# Patient Record
Sex: Male | Born: 1950 | Race: White | Hispanic: No | Marital: Single | Smoking: Never smoker
Health system: Southern US, Community
[De-identification: ages and names within clinical notes are randomized; demographics above are authoritative.]

## PROBLEM LIST (undated history)

## (undated) DIAGNOSIS — M1990 Unspecified osteoarthritis, unspecified site: Secondary | ICD-10-CM

## (undated) DIAGNOSIS — Z974 Presence of external hearing-aid: Secondary | ICD-10-CM

## (undated) DIAGNOSIS — J309 Allergic rhinitis, unspecified: Secondary | ICD-10-CM

## (undated) DIAGNOSIS — I872 Venous insufficiency (chronic) (peripheral): Secondary | ICD-10-CM

## (undated) DIAGNOSIS — I10 Essential (primary) hypertension: Secondary | ICD-10-CM

## (undated) DIAGNOSIS — G609 Hereditary and idiopathic neuropathy, unspecified: Secondary | ICD-10-CM

## (undated) DIAGNOSIS — C61 Malignant neoplasm of prostate: Secondary | ICD-10-CM

## (undated) DIAGNOSIS — M109 Gout, unspecified: Secondary | ICD-10-CM

## (undated) DIAGNOSIS — E538 Deficiency of other specified B group vitamins: Secondary | ICD-10-CM

## (undated) DIAGNOSIS — E785 Hyperlipidemia, unspecified: Secondary | ICD-10-CM

## (undated) DIAGNOSIS — Z9109 Other allergy status, other than to drugs and biological substances: Secondary | ICD-10-CM

## (undated) DIAGNOSIS — K449 Diaphragmatic hernia without obstruction or gangrene: Secondary | ICD-10-CM

## (undated) DIAGNOSIS — E7800 Pure hypercholesterolemia, unspecified: Secondary | ICD-10-CM

## (undated) HISTORY — DX: Gout, unspecified: M10.9

## (undated) HISTORY — PX: PROSTATE BIOPSY: SHX241

## (undated) HISTORY — DX: Malignant neoplasm of prostate: C61

---

## 2001-11-06 ENCOUNTER — Ambulatory Visit (HOSPITAL_COMMUNITY): Admission: RE | Admit: 2001-11-06 | Discharge: 2001-11-06 | Payer: Self-pay | Admitting: Family Medicine

## 2001-11-06 ENCOUNTER — Encounter: Payer: Self-pay | Admitting: Family Medicine

## 2007-07-20 ENCOUNTER — Emergency Department (HOSPITAL_COMMUNITY): Admission: EM | Admit: 2007-07-20 | Discharge: 2007-07-20 | Payer: Self-pay | Admitting: Emergency Medicine

## 2010-08-13 ENCOUNTER — Inpatient Hospital Stay (INDEPENDENT_AMBULATORY_CARE_PROVIDER_SITE_OTHER)
Admission: RE | Admit: 2010-08-13 | Discharge: 2010-08-13 | Disposition: A | Discharge status: Home / Self Care | Payer: BC Managed Care – PPO | Source: Ambulatory Visit | Attending: Family Medicine | Admitting: Family Medicine

## 2010-08-13 ENCOUNTER — Emergency Department (HOSPITAL_COMMUNITY)
Admission: EM | Admit: 2010-08-13 | Discharge: 2010-08-13 | Disposition: A | Discharge status: Home / Self Care | Payer: BC Managed Care – PPO | Attending: Emergency Medicine | Admitting: Emergency Medicine

## 2010-08-13 ENCOUNTER — Ambulatory Visit (INDEPENDENT_AMBULATORY_CARE_PROVIDER_SITE_OTHER): Payer: BC Managed Care – PPO

## 2010-08-13 DIAGNOSIS — M79609 Pain in unspecified limb: Secondary | ICD-10-CM | POA: Insufficient documentation

## 2010-08-13 DIAGNOSIS — S61409A Unspecified open wound of unspecified hand, initial encounter: Secondary | ICD-10-CM | POA: Insufficient documentation

## 2010-08-13 DIAGNOSIS — L03119 Cellulitis of unspecified part of limb: Secondary | ICD-10-CM

## 2010-08-13 DIAGNOSIS — L02519 Cutaneous abscess of unspecified hand: Secondary | ICD-10-CM | POA: Insufficient documentation

## 2010-08-13 DIAGNOSIS — X58XXXA Exposure to other specified factors, initial encounter: Secondary | ICD-10-CM | POA: Insufficient documentation

## 2010-08-13 DIAGNOSIS — M7989 Other specified soft tissue disorders: Secondary | ICD-10-CM | POA: Insufficient documentation

## 2010-08-13 DIAGNOSIS — E789 Disorder of lipoprotein metabolism, unspecified: Secondary | ICD-10-CM | POA: Insufficient documentation

## 2010-08-13 DIAGNOSIS — Z87828 Personal history of other (healed) physical injury and trauma: Secondary | ICD-10-CM | POA: Insufficient documentation

## 2010-08-13 DIAGNOSIS — B999 Unspecified infectious disease: Secondary | ICD-10-CM

## 2010-08-13 DIAGNOSIS — I10 Essential (primary) hypertension: Secondary | ICD-10-CM | POA: Insufficient documentation

## 2010-08-16 LAB — WOUND CULTURE

## 2011-02-03 ENCOUNTER — Inpatient Hospital Stay (HOSPITAL_COMMUNITY)
Admission: EM | Admit: 2011-02-03 | Discharge: 2011-02-06 | DRG: 297 | Disposition: A | Discharge status: Home / Self Care | Payer: BC Managed Care – PPO | Attending: Internal Medicine | Admitting: Internal Medicine

## 2011-02-03 ENCOUNTER — Encounter: Payer: Self-pay | Admitting: Emergency Medicine

## 2011-02-03 DIAGNOSIS — R933 Abnormal findings on diagnostic imaging of other parts of digestive tract: Secondary | ICD-10-CM | POA: Diagnosis present

## 2011-02-03 DIAGNOSIS — E871 Hypo-osmolality and hyponatremia: Principal | ICD-10-CM | POA: Diagnosis present

## 2011-02-03 DIAGNOSIS — A059 Bacterial foodborne intoxication, unspecified: Secondary | ICD-10-CM | POA: Diagnosis present

## 2011-02-03 DIAGNOSIS — E7800 Pure hypercholesterolemia, unspecified: Secondary | ICD-10-CM

## 2011-02-03 DIAGNOSIS — R066 Hiccough: Secondary | ICD-10-CM | POA: Diagnosis present

## 2011-02-03 DIAGNOSIS — R410 Disorientation, unspecified: Secondary | ICD-10-CM

## 2011-02-03 DIAGNOSIS — R112 Nausea with vomiting, unspecified: Secondary | ICD-10-CM

## 2011-02-03 DIAGNOSIS — L299 Pruritus, unspecified: Secondary | ICD-10-CM | POA: Diagnosis present

## 2011-02-03 DIAGNOSIS — G9340 Encephalopathy, unspecified: Secondary | ICD-10-CM | POA: Diagnosis present

## 2011-02-03 DIAGNOSIS — I10 Essential (primary) hypertension: Secondary | ICD-10-CM

## 2011-02-03 HISTORY — DX: Pure hypercholesterolemia, unspecified: E78.00

## 2011-02-03 HISTORY — DX: Essential (primary) hypertension: I10

## 2011-02-03 NOTE — ED Notes (Signed)
Pain increasing.

## 2011-02-03 NOTE — ED Notes (Signed)
Pt states he ate some fish, [place],  on Sunday afternoon in the [place] Monday morning he went to the hospital in the [place] and received a shot that broke him out in hives  Pt stayed in the [place] til Wednesday  Pt was seen at a hospital in [facility] on Wed  Pt has had hiccups since Sunday and pt is c/o stomach pain  Pt is weak, has nausea, and family states he has been disoriented  Pt has been taking thioridazine 25mg  for his hiccups but it is not helping and has been taking tums for the abd pain

## 2011-02-04 ENCOUNTER — Emergency Department (HOSPITAL_COMMUNITY): Payer: BC Managed Care – PPO

## 2011-02-04 ENCOUNTER — Encounter (HOSPITAL_COMMUNITY): Payer: Self-pay | Admitting: Family Medicine

## 2011-02-04 ENCOUNTER — Inpatient Hospital Stay (HOSPITAL_COMMUNITY): Payer: BC Managed Care – PPO

## 2011-02-04 DIAGNOSIS — R066 Hiccough: Secondary | ICD-10-CM | POA: Diagnosis present

## 2011-02-04 DIAGNOSIS — I10 Essential (primary) hypertension: Secondary | ICD-10-CM

## 2011-02-04 DIAGNOSIS — G9340 Encephalopathy, unspecified: Secondary | ICD-10-CM | POA: Diagnosis present

## 2011-02-04 DIAGNOSIS — E7800 Pure hypercholesterolemia, unspecified: Secondary | ICD-10-CM

## 2011-02-04 DIAGNOSIS — E871 Hypo-osmolality and hyponatremia: Secondary | ICD-10-CM | POA: Diagnosis present

## 2011-02-04 DIAGNOSIS — R112 Nausea with vomiting, unspecified: Secondary | ICD-10-CM

## 2011-02-04 LAB — CBC
HCT: 37 % — ABNORMAL LOW (ref 39.0–52.0)
MCHC: 35.3 g/dL (ref 30.0–36.0)
MCV: 92.7 fL (ref 78.0–100.0)
Platelets: 226 10*3/uL (ref 150–400)
RBC: 3.99 MIL/uL — ABNORMAL LOW (ref 4.22–5.81)
RDW: 11.9 % (ref 11.5–15.5)
WBC: 11.8 10*3/uL — ABNORMAL HIGH (ref 4.0–10.5)
WBC: 8.9 10*3/uL (ref 4.0–10.5)

## 2011-02-04 LAB — URINALYSIS, ROUTINE W REFLEX MICROSCOPIC
Bilirubin Urine: NEGATIVE
Bilirubin Urine: NEGATIVE
Glucose, UA: NEGATIVE mg/dL
Hgb urine dipstick: NEGATIVE
Ketones, ur: NEGATIVE mg/dL
Ketones, ur: NEGATIVE mg/dL
Leukocytes, UA: NEGATIVE
Nitrite: NEGATIVE
Urobilinogen, UA: 0.2 mg/dL (ref 0.0–1.0)
pH: 6.5 (ref 5.0–8.0)

## 2011-02-04 LAB — COMPREHENSIVE METABOLIC PANEL
ALT: 16 U/L (ref 0–53)
AST: 13 U/L (ref 0–37)
Albumin: 3.9 g/dL (ref 3.5–5.2)
Albumin: 3.7 g/dL (ref 3.5–5.2)
Alkaline Phosphatase: 50 U/L (ref 39–117)
BUN: 9 mg/dL (ref 6–23)
CO2: 25 mEq/L (ref 19–32)
Calcium: 9.4 mg/dL (ref 8.4–10.5)
Chloride: 89 mEq/L — ABNORMAL LOW (ref 96–112)
Creatinine, Ser: 0.9 mg/dL (ref 0.50–1.35)
Potassium: 3.4 mEq/L — ABNORMAL LOW (ref 3.5–5.1)
Sodium: 123 mEq/L — ABNORMAL LOW (ref 135–145)
Total Bilirubin: 0.6 mg/dL (ref 0.3–1.2)
Total Protein: 7.4 g/dL (ref 6.0–8.3)

## 2011-02-04 LAB — DIFFERENTIAL
Basophils Absolute: 0 10*3/uL (ref 0.0–0.1)
Basophils Relative: 0 % (ref 0–1)
Lymphocytes Relative: 13 % (ref 12–46)
Neutro Abs: 9.3 10*3/uL — ABNORMAL HIGH (ref 1.7–7.7)
Neutrophils Relative %: 79 % — ABNORMAL HIGH (ref 43–77)

## 2011-02-04 LAB — BASIC METABOLIC PANEL
CO2: 25 mEq/L (ref 19–32)
Calcium: 8.9 mg/dL (ref 8.4–10.5)
GFR calc Af Amer: >90 mL/min (ref >90)
Sodium: 135 mEq/L (ref 135–145)

## 2011-02-04 LAB — OSMOLALITY: Osmolality: 275 mOsm/kg (ref 275–300)

## 2011-02-04 LAB — GLUCOSE, CAPILLARY: Glucose-Capillary: 122 mg/dL — ABNORMAL HIGH (ref 70–99)

## 2011-02-04 LAB — FOLATE: Folate: 18.4 ng/mL

## 2011-02-04 LAB — VITAMIN B12: Vitamin B-12: 333 pg/mL (ref 211–911)

## 2011-02-04 MED ORDER — METOPROLOL TARTRATE 25 MG PO TABS
25.0000 mg | ORAL_TABLET | Freq: Two times a day (BID) | ORAL | Status: DC
  Administered 2011-02-04 – 2011-02-06 (×4): 25 mg via ORAL
  Filled 2011-02-04 (×5): qty 1

## 2011-02-04 MED ORDER — PANTOPRAZOLE SODIUM 40 MG PO TBEC
40.0000 mg | DELAYED_RELEASE_TABLET | Freq: Every day | ORAL | Status: DC
  Administered 2011-02-04 – 2011-02-06 (×3): 40 mg via ORAL
  Filled 2011-02-04 (×2): qty 1

## 2011-02-04 MED ORDER — DIPHENHYDRAMINE HCL 50 MG/ML IJ SOLN
12.5000 mg | Freq: Once | INTRAMUSCULAR | Status: AC
  Administered 2011-02-04: 12.5 mg via INTRAVENOUS
  Filled 2011-02-04: qty 1

## 2011-02-04 MED ORDER — ENOXAPARIN SODIUM 40 MG/0.4ML ~~LOC~~ SOLN
40.0000 mg | SUBCUTANEOUS | Status: AC
  Administered 2011-02-04: 40 mg via SUBCUTANEOUS
  Filled 2011-02-04: qty 0.4

## 2011-02-04 MED ORDER — POTASSIUM CHLORIDE IN NACL 20-0.9 MEQ/L-% IV SOLN
INTRAVENOUS | Status: DC
  Filled 2011-02-04: qty 1000

## 2011-02-04 MED ORDER — DIPHENHYDRAMINE HCL 50 MG/ML IJ SOLN
12.5000 mg | Freq: Three times a day (TID) | INTRAMUSCULAR | Status: DC | PRN
  Administered 2011-02-04 – 2011-02-05 (×3): 12.5 mg via INTRAVENOUS
  Filled 2011-02-04 (×3): qty 1

## 2011-02-04 MED ORDER — SODIUM CHLORIDE 0.9 % IV BOLUS (SEPSIS)
1000.0000 mL | Freq: Once | INTRAVENOUS | Status: AC
  Administered 2011-02-04: 1000 mL via INTRAVENOUS

## 2011-02-04 MED ORDER — METOPROLOL TARTRATE 25 MG PO TABS
25.0000 mg | ORAL_TABLET | ORAL | Status: AC
  Administered 2011-02-04: 25 mg via ORAL
  Filled 2011-02-04: qty 1

## 2011-02-04 MED ORDER — HYDROCODONE-ACETAMINOPHEN 5-325 MG PO TABS
1.0000 | ORAL_TABLET | ORAL | Status: DC | PRN
  Administered 2011-02-04 – 2011-02-05 (×4): 2 via ORAL
  Filled 2011-02-04 (×4): qty 2

## 2011-02-04 MED ORDER — ONDANSETRON HCL 4 MG/2ML IJ SOLN: 4.0000 mg | Freq: Four times a day (QID) | INTRAMUSCULAR | Status: DC | PRN

## 2011-02-04 MED ORDER — ACETAMINOPHEN 325 MG PO TABS: 650.0000 mg | ORAL_TABLET | Freq: Four times a day (QID) | ORAL | Status: DC | PRN

## 2011-02-04 MED ORDER — SODIUM CHLORIDE 0.9 % IV SOLN
INTRAVENOUS | Status: DC
  Administered 2011-02-04 (×2): via INTRAVENOUS

## 2011-02-04 MED ORDER — METOCLOPRAMIDE HCL 5 MG/ML IJ SOLN
5.0000 mg | Freq: Four times a day (QID) | INTRAMUSCULAR | Status: DC | PRN
  Administered 2011-02-04: 5 mg via INTRAVENOUS
  Filled 2011-02-04: qty 2

## 2011-02-04 MED ORDER — MORPHINE SULFATE 4 MG/ML IJ SOLN
INTRAMUSCULAR | Status: AC
  Administered 2011-02-04: 1 mg via INTRAVENOUS
  Filled 2011-02-04: qty 1

## 2011-02-04 MED ORDER — METOCLOPRAMIDE HCL 5 MG/ML IJ SOLN: 5.0000 mg | Freq: Four times a day (QID) | INTRAMUSCULAR | Status: DC | PRN

## 2011-02-04 MED ORDER — IOHEXOL 300 MG/ML  SOLN
100.0000 mL | Freq: Once | INTRAMUSCULAR | Status: AC | PRN
  Administered 2011-02-04: 100 mL via INTRAVENOUS

## 2011-02-04 MED ORDER — ENOXAPARIN SODIUM 40 MG/0.4ML ~~LOC~~ SOLN
40.0000 mg | SUBCUTANEOUS | Status: DC
  Administered 2011-02-05 – 2011-02-06 (×2): 40 mg via SUBCUTANEOUS
  Filled 2011-02-04 (×3): qty 0.4

## 2011-02-04 MED ORDER — CHLORPROMAZINE HCL 25 MG PO TABS
25.0000 mg | ORAL_TABLET | Freq: Three times a day (TID) | ORAL | Status: DC | PRN
  Administered 2011-02-06: 25 mg via ORAL
  Filled 2011-02-04: qty 1

## 2011-02-04 MED ORDER — ONDANSETRON HCL 4 MG/2ML IJ SOLN
4.0000 mg | Freq: Once | INTRAMUSCULAR | Status: AC
  Administered 2011-02-04: 4 mg via INTRAVENOUS
  Filled 2011-02-04: qty 2

## 2011-02-04 MED ORDER — ACETAMINOPHEN 650 MG RE SUPP: 650.0000 mg | Freq: Four times a day (QID) | RECTAL | Status: DC | PRN

## 2011-02-04 MED ORDER — MORPHINE SULFATE 2 MG/ML IJ SOLN
1.0000 mg | INTRAMUSCULAR | Status: DC | PRN
  Administered 2011-02-04: 1 mg via INTRAVENOUS

## 2011-02-04 MED ORDER — ONDANSETRON HCL 4 MG PO TABS: 4.0000 mg | ORAL_TABLET | Freq: Four times a day (QID) | ORAL | Status: DC | PRN

## 2011-02-04 MED ORDER — LORAZEPAM 2 MG/ML IJ SOLN
0.5000 mg | Freq: Four times a day (QID) | INTRAMUSCULAR | Status: DC | PRN
  Administered 2011-02-04 – 2011-02-05 (×4): 0.5 mg via INTRAVENOUS
  Filled 2011-02-04 (×4): qty 1

## 2011-02-04 MED ORDER — HYDROMORPHONE HCL PF 1 MG/ML IJ SOLN: 0.5000 mg | INTRAMUSCULAR | Status: DC | PRN

## 2011-02-04 MED ORDER — POTASSIUM CHLORIDE IN NACL 20-0.9 MEQ/L-% IV SOLN
INTRAVENOUS | Status: AC
  Filled 2011-02-04: qty 1000

## 2011-02-04 MED ORDER — ZOLPIDEM TARTRATE 5 MG PO TABS: 5.0000 mg | ORAL_TABLET | Freq: Every evening | ORAL | Status: DC | PRN

## 2011-02-04 NOTE — ED Notes (Signed)
Pt transported to the radiology at this time

## 2011-02-04 NOTE — H&P (Addendum)
PCP:  Dr Lawrence Scott  Chief Complaint:  Nausea, vomiting, confused  HPI: This is a unfortunate 60 y/o gentleman who was on vacation in the [place], he states he ate grouper fish at 5PM and by 8PM he had severe nausea, vomiting and diarrhea that led him to be hospitalized for 2 days in the [place]. He was finally airlifted out to a hospital in [facility]. By the time of his transfer his vomiting and diarrhea had resolved, he still has some nausea. He also has severe hiccups. This was treated with Mellaril without improvement. He had no hematemesis, today he had a bowel movement and on wiping noted a bloody smear on the tissue. He reports some abdominal discomfort but mostly with the hiccups. He has no fever but his is constantly cold. He was discharged from the hospital in [place] after one day and flew home. He has continued not to feel well, not eating. His girlfriend who brought him to the ER today states he has also been confused and forgetful. History obtained from the patient who now appears alert and oriented but ill, as well as his girlfriend who is at the bedside.  Review of Systems: (positives bolded) The patient denies anorexia, fever, weight loss,, vision loss, decreased hearing, hoarseness, chest pain, syncope, dyspnea on exertion, peripheral edema, balance deficits, hemoptysis, abdominal pain, melena, hematochezia, severe indigestion/heartburn, hematuria, incontinence, genital sores, muscle weakness, suspicious skin lesions, transient blindness, difficulty walking, depression, unusual weight change, abnormal bleeding, enlarged lymph nodes, angioedema, and breast masses.  Past Medical History: Past Medical History  Diagnosis Date  . High cholesterol   . Hypertension    History reviewed. No pertinent past surgical history.  Medications: Prior to Admission medications   Medication Sig Start Date End Date Taking? Authorizing Provider  calcium carbonate (TUMS - DOSED IN MG  ELEMENTAL CALCIUM) 500 MG chewable tablet Chew 1 tablet by mouth as needed. HEARTBURN...   Yes Historical Provider, MD  thioridazine (MELLARIL) 25 MG tablet Take 25 mg by mouth 3 (three) times daily. STARTED 02/02/11...Marland KitchenCOURSE IS FOR 7 DAYS FOR HICCUPS.   Yes Historical Provider, MD    Allergies:  No Known Allergies  Social History:  reports that he has never smoked. He does not have any smokeless tobacco history on file. He reports that he drinks about .5 ounces of alcohol per week. He reports that he does not use illicit drugs.  Family History: Family History  Problem Relation Age of Onset  . Lung cancer      Physical Exam: Filed Vitals:   02/04/11 0041 02/04/11 0200 02/04/11 0209 02/04/11 0308  BP: 159/92 179/85  168/80  Pulse: 83 101 80 89  Temp: 98.8 F (37.1 C)   96.4 F (35.8 C)  TempSrc: Oral   Oral  Resp: 20     SpO2: 99% 97% 100% 100%    General:  Alert and oriented times three, well developed and nourished, ill appearing gentleman, hiccups Eyes: PERRLA, pink conjunctiva, no scleral icterus ENT: Dry oral mucosa, neck supple, no thyromegaly Lungs: clear to ascultation, no wheeze, no crackles, no use of accessory muscles Cardiovascular: regular rate and rhythm, no regurgitation, no gallops, no murmurs. No carotid bruits, no JVD Abdomen: soft, positive BS, non-tender, non-distended, no organomegaly, not an acute abdomen GU: not examined Neuro: CN II - XII grossly intact, sensation intact Musculoskeletal: strength 5/5 all extremities, no clubbing, cyanosis or edema, reproducible TTP abdomen Skin: no rash, no subcutaneous crepitation, no decubitus Psych: appropriate patient  Labs on Admission:   [facility] 02/04/11 0045  NA 123*  K 3.4*  CL 89*  CO2 25  GLUCOSE 110*  BUN 10  CREATININE 0.87  CALCIUM 9.5  MG --  PHOS --    Basename 02/04/11 0045  AST 13  ALT 16  ALKPHOS 50  BILITOT 0.6  PROT 7.6  ALBUMIN 3.9    Basename 02/04/11 0045  LIPASE 29    AMYLASE --    Basename 02/04/11 0045  WBC 11.8*  NEUTROABS 9.3*  HGB 13.0  HCT 36.8*  MCV 92.5  PLT 224   No results found for this basename: CKTOTAL:3,CKMB:3,CKMBINDEX:3,TROPONINI:3 in the last 72 hours No results found for this basename: TSH,T4TOTAL,FREET3,T3FREE,THYROIDAB in the last 72 hours No results found for this basename: VITAMINB12:2,FOLATE:2,FERRITIN:2,TIBC:2,IRON:2,RETICCTPCT:2 in the last 72 hours  Radiological Exams on Admission: No results found.  Assessment/Plan Present on Admission:  Hyponatremia - ? d/t N/V food poisoning Encephalopathy - likely d/t hyponatremia -admit -start w/u for hyponatremia, CXR, TSH, urine sodium, urine and plasma osmolarity -IVF started, serial BMP -PPI -CLD Hiccups -on thorazine without relief, add PRN reglan Nausea -diarrhea resolved -PPI and zofran PRN HTN -metoprolol  Tingling finger tips -likely related to electrolyte imbalance, also check Bit B12 level.   Code status: full code DVT/GI prophylaxis Dr Lawrence Scott assumes care in AM   Lawrence Scott, Lawrence Scott 02/04/2011, 3:40 AM    Chart reviewed patient admitted this morning with severe Hiccups.  Continue current treatment.  Sodium has improved with IVF.  Continue to monitor

## 2011-02-04 NOTE — ED Notes (Signed)
Pt aware of need for urine specimen; warm blankets given; family at bedside

## 2011-02-04 NOTE — ED Provider Notes (Signed)
History     CSN: [id number] Arrival date & time: 02/03/2011  7:15 PM   First MD Initiated Contact with Patient 02/04/11 0049      Chief Complaint  Patient presents with  . Food Poisioning/Fish     (Consider location/radiation/quality/duration/timing/severity/associated sxs/prior treatment) Patient is a 60 y.o. male presenting with vomiting. The history is provided by the patient.  Emesis  This is a new problem. The current episode started 2 days ago. The problem occurs more than 10 times per day. The problem has been gradually worsening. The emesis has an appearance of stomach contents (No blood or bilious emesis). Maximum temperature: Subjective fevers on and off. Associated symptoms include abdominal pain, diarrhea, myalgias and sweats. Pertinent negatives include no chills, no fever and no headaches. Risk factors include travel to endemic areas (He believes symptoms started after eating fish in the vomitus).   Symptoms are moderate to severe in nature abdominal pain described as sharp epigastric and intermittent not present at this time. He was hospitalized for this 2 days ago in the [place] and was transported to [place] where he was admitted and now presents here for further evaluation. Symptoms are persistent. His significant other bedside states he is confused. He complains of some tingling in his fingers but no perioral paresthesias, no painful paresthesias in his extremities, no complaints of temperature reversal. No rash or flushing. Past Medical History  Diagnosis Date  . High cholesterol     History reviewed. No pertinent past surgical history.  History reviewed. No pertinent family history.  History  Substance Use Topics  . Smoking status: Never Smoker   . Smokeless tobacco: Not on file  . Alcohol Use: 0.5 oz/week    1 drink(s) per week     one mixed drink before bed       Review of Systems  Constitutional: Negative for fever and chills.  HENT: Negative for neck  pain and neck stiffness.   Eyes: Negative for pain.  Respiratory: Negative for shortness of breath.   Cardiovascular: Negative for chest pain and palpitations.  Gastrointestinal: Positive for vomiting, abdominal pain and diarrhea. Negative for constipation and blood in stool.  Genitourinary: Negative for dysuria.  Musculoskeletal: Positive for myalgias. Negative for back pain.  Skin: Negative for rash.  Neurological: Negative for headaches.  Psychiatric/Behavioral: Positive for confusion.  All other systems reviewed and are negative.    Allergies  Review of patient's allergies indicates no known allergies.  Home Medications   Current Outpatient Rx  Name Route Sig Dispense Refill  . CALCIUM CARBONATE ANTACID 500 MG PO CHEW Oral Chew 1 tablet by mouth as needed. HEARTBURN...    . THIORIDAZINE HCL 25 MG PO TABS Oral Take 25 mg by mouth 3 (three) times daily. STARTED 02/02/11...Marland KitchenCOURSE IS FOR 7 DAYS FOR HICCUPS.      BP 179/85  Pulse 80  Temp(Src) 98.8 F (37.1 C) (Oral)  Resp 20  SpO2 100%  Physical Exam  Constitutional: He is oriented to person, place, and time. He appears well-developed and well-nourished.  HENT:  Head: Normocephalic and atraumatic.  Eyes: Conjunctivae and EOM are normal. Pupils are equal, round, and reactive to light.  Neck: Trachea normal. Neck supple. No thyromegaly present.  Cardiovascular: Normal rate, regular rhythm, S1 normal, S2 normal and normal pulses.     No systolic murmur is present   No diastolic murmur is present  Pulses:      Radial pulses are 2+ on the right side, and 2+ on  the left side.  Pulmonary/Chest: Effort normal and breath sounds normal. He has no wheezes. He has no rhonchi. He has no rales. He exhibits no tenderness.  Abdominal: Soft. Normal appearance and bowel sounds are normal. There is no tenderness. There is no CVA tenderness and negative Murphy's sign.       No peritonitis. No localized area of discomfort.  Musculoskeletal:        BLE:s Calves nontender, no cords or erythema, negative Homans sign  Neurological: He is alert and oriented to person, place, and time. He has normal strength. No cranial nerve deficit or sensory deficit. GCS eye subscore is 4. GCS verbal subscore is 5. GCS motor subscore is 6.       Speech is slow otherwise answers all questions appropriately  Skin: Skin is warm and dry. No rash noted. He is not diaphoretic.  Psychiatric: His speech is normal.       Cooperative and appropriate    ED Course  Procedures (including critical care time)  Labs Reviewed  GLUCOSE, CAPILLARY - Abnormal; Notable for the following:    Glucose-Capillary 122 (*)    All other components within normal limits  CBC - Abnormal; Notable for the following:    WBC 11.8 (*)    RBC 3.98 (*)    HCT 36.8 (*)    All other components within normal limits  DIFFERENTIAL - Abnormal; Notable for the following:    Neutrophils Relative 79 (*)    Neutro Abs 9.3 (*)    All other components within normal limits  COMPREHENSIVE METABOLIC PANEL - Abnormal; Notable for the following:    Sodium 123 (*)    Potassium 3.4 (*)    Chloride 89 (*)    Glucose, Bld 110 (*)    All other components within normal limits  URINALYSIS, ROUTINE W REFLEX MICROSCOPIC - Abnormal; Notable for the following:    Specific Gravity, Urine 1.003 (*)    All other components within normal limits  LIPASE, BLOOD  POCT CBG MONITORING   No results found.  2:30 AM MED c/s to the hospitalist on call who agrees to evaluation and admission. Serial abdominal exams, no peritonitis or indication for CT at this time  MDM  Nausea vomiting and diarrhea with confusion. Found to be hyponatremic. Given IV fluids and Zofran. Labs reviewed as above. Medicine consult for admission.        Sunnie Nielsen, MD 02/04/11 0230

## 2011-02-04 NOTE — ED Notes (Signed)
Pt sitting up; positioned pillow behind his back which he states has made him feel better; no hiccups at this time; denies any c/o chest pain; bolus completed; tel nsr; family at bedside; no nausea

## 2011-02-04 NOTE — ED Notes (Signed)
Admitting MD in the room with the pt, family at the bedside, no apparent distress noted, pt lying on the stretcher, breathing even and regular, at this time pt requesting urinal,  At the bediside

## 2011-02-04 NOTE — ED Notes (Signed)
Pt states that he become itchy in between his toes, potassium started prior, at this time iv infusion stopped, EDP notified, pt frequently checked, pt denies Shob, difficulty breathing, no itching reported to any other area.

## 2011-02-05 LAB — BASIC METABOLIC PANEL
CO2: 25 mEq/L (ref 19–32)
Chloride: 103 mEq/L (ref 96–112)
Creatinine, Ser: 1.09 mg/dL (ref 0.50–1.35)
Potassium: 3.7 mEq/L (ref 3.5–5.1)
Sodium: 135 mEq/L (ref 135–145)

## 2011-02-05 LAB — DRUGS OF ABUSE SCREEN W/O ALC, ROUTINE URINE
Barbiturate Quant, Ur: NEGATIVE
Cocaine Metabolites: NEGATIVE
Methadone: NEGATIVE
Phencyclidine (PCP): NEGATIVE

## 2011-02-05 LAB — CBC
MCV: 95.1 fL (ref 78.0–100.0)
Platelets: 251 10*3/uL (ref 150–400)
RBC: 3.86 MIL/uL — ABNORMAL LOW (ref 4.22–5.81)
WBC: 7.4 10*3/uL (ref 4.0–10.5)

## 2011-02-05 LAB — OSMOLALITY, URINE: Osmolality, Ur: 380 mOsm/kg — ABNORMAL LOW (ref 390–1090)

## 2011-02-05 MED ORDER — DIPHENHYDRAMINE HCL 25 MG PO CAPS
25.0000 mg | ORAL_CAPSULE | Freq: Four times a day (QID) | ORAL | Status: DC | PRN
  Administered 2011-02-05: 25 mg via ORAL
  Filled 2011-02-05: qty 1

## 2011-02-05 MED ORDER — METHYLPREDNISOLONE SODIUM SUCC 40 MG IJ SOLR
40.0000 mg | Freq: Every day | INTRAMUSCULAR | Status: AC
  Administered 2011-02-05: 40 mg via INTRAVENOUS
  Filled 2011-02-05: qty 1

## 2011-02-05 NOTE — Progress Notes (Signed)
Subjective: Complains of itching all over  Objective: Vital signs in last 24 hours: Temp:  [98.2 F (36.8 C)-99.6 F (37.6 C)] 99.3 F (37.4 C) (11/11 1407) Pulse Rate:  [74-83] 81  (11/11 1407) Resp:  [16-18] 18  (11/11 1407) BP: (126-161)/(77-89) 159/83 mmHg (11/11 1407) SpO2:  [95 %-99 %] 99 % (11/11 1407) Weight change:   -Intake/Output from previous day: 11/10 0701 - 11/11 0700 In: 1980 [P.O.:480; I.V.:1500] Out: 1500 [Urine:1500]  Blood pressure 159/83, pulse 81, temperature 99.3 F (37.4 C), temperature source Oral, resp. rate 18, height 5\' 11"  (1.803 m), weight 96.3 kg (212 lb 4.9 oz), SpO2 99.00%. HEENT: normal Cardio: RRR Resp: CTA B/L GI: BS positive Extremity:  No Edema  Lab Results: Lab Results  Component Value Date   WBC 7.4 02/05/2011   HGB 12.8* 02/05/2011   HCT 36.7* 02/05/2011   MCV 95.1 02/05/2011   PLT 251 02/05/2011    BMET  Lab 02/05/11 0530  NA 135  K 3.7  CL 103  CO2 25  BUN 10  CREATININE 1.09  LABGLOM --  GLUCOSE 99  CALCIUM 8.9    Studies/Results: Dg Chest 2 View  02/04/2011  *RADIOLOGY REPORT*  Clinical Data: Shortness of breath and transient chest pain; hyponatremia.  CHEST - 2 VIEW  Comparison: None.  Findings: The lungs are well-aerated.  Minimal bibasilar opacities likely reflect atelectasis.  There is no evidence of pleural effusion or pneumothorax.  The heart is borderline normal in size; the mediastinal contour is within normal limits.  No acute osseous abnormalities are seen.  IMPRESSION: Minimal bibasilar opacities likely reflect atelectasis; lungs otherwise clear.  Original Report Authenticated By: Tonia Ghent, M.D.   Ct Angio Chest W/cm &/or Wo Cm  02/04/2011  *RADIOLOGY REPORT*  Clinical Data:  Hiccups.  Shortness of breath.  Chest pain.  CT ANGIOGRAPHY CHEST WITH CONTRAST  Technique:  Multidetector CT imaging of the chest was performed using the standard protocol during bolus administration of intravenous contrast.   Multiplanar CT image reconstructions including MIPs were obtained to evaluate the vascular anatomy.  Contrast: OMNIPAQUE IOHEXOL 300 MG/ML IV SOLN  Comparison:  Two-view chest x-ray 02/04/2011.  Findings:  There is satisfactory opacification of the pulmonary arterial system.  The study is mildly degraded by patient motion. No focal filling defects are present to suggest emboli.  Atherosclerotic calcifications are present at the aortic arch without aneurysm or focal stenosis.  No significant mediastinal or axillary adenopathy is present. Atherosclerotic calcifications are noted within the LAD.  There is moderate wall thickening in the distal esophagus.  More proximal esophagus is dilated.  A small hiatal hernia is suspected.  Limited imaging of the upper abdomen is unremarkable.  The lung windows are mildly degraded by patient breathing motion. There is mild dependent atelectasis bilaterally.  No focal nodule, mass, or airspace disease is present.  The bone windows demonstrate prominent anterior osteophytes in the lower thoracic spine.  No focal lytic or blastic lesions are present.  Review of the MIP images confirms the above findings.  IMPRESSION:  1.  No evidence for pulmonary embolus. 2.  Thickening of the distal esophagus and proximal dilation.  This suggests esophageal dysfunction.  No focal mass lesion is identified, but neoplasm is not excluded. Endoscopy may be warranted. 3.  Small hiatal hernia. 4.  Mild dependent atelectasis. 5.  The study is mildly degraded by patient breathing motion, limiting sensitivity for small emboli. 6.  Atherosclerotic changes including coronary artery disease.  Original Report Authenticated By: Jamesetta Orleans. MATTERN, M.D.    Medications:  Current Facility-Administered Medications  Medication Dose Route Frequency Provider Last Rate Last Dose  . 0.9 % NaCl with KCl 20 mEq/ L  infusion   Intravenous To ER Debby Crosley, MD      . acetaminophen (TYLENOL) tablet 650 mg   650 mg Oral Q6H PRN Debby Crosley, MD       Or  . acetaminophen (TYLENOL) suppository 650 mg  650 mg Rectal Q6H PRN Debby Crosley, MD      . chlorproMAZINE (THORAZINE) tablet 25 mg  25 mg Oral TID PRN Debby Crosley, MD      . diphenhydrAMINE (BENADRYL) capsule 25 mg  25 mg Oral Q6H PRN Tiphany Fayson Jarrett-Davis      . diphenhydrAMINE (BENADRYL) injection 12.5 mg  12.5 mg Intravenous Once Chaelyn Bunyan Jarrett-Davis   12.5 mg at 02/04/11 1857  . enoxaparin (LOVENOX) injection 40 mg  40 mg Subcutaneous Q24H Debby Crosley, MD   40 mg at 02/05/11 0550  . HYDROcodone-acetaminophen (NORCO) 5-325 MG per tablet 1-2 tablet  1-2 tablet Oral Q4H PRN Gery Pray, MD   2 tablet at 02/05/11 0916  . HYDROmorphone (DILAUDID) injection 0.5 mg  0.5 mg Intravenous Q4H PRN Kataleena Holsapple Jarrett-Davis      . iohexol (OMNIPAQUE) 300 MG/ML injection 100 mL  100 mL Intravenous Once PRN Medication Radiologist   100 mL at 02/04/11 1921  . LORazepam (ATIVAN) injection 0.5 mg  0.5 mg Intravenous Q6H PRN Jamar Casagrande Jarrett-Davis   0.5 mg at 02/05/11 1637  . methylPREDNISolone sodium succinate (SOLU-MEDROL) 40 MG injection 40 mg  40 mg Intravenous Daily Tilley Faeth Jarrett-Davis      . metoCLOPramide (REGLAN) injection 5 mg  5 mg Intravenous Q6H PRN Debby Crosley, MD   5 mg at 02/04/11 1102  . metoCLOPramide (REGLAN) injection 5 mg  5 mg Intravenous Q6H PRN Debby Crosley, MD      . metoprolol tartrate (LOPRESSOR) tablet 25 mg  25 mg Oral BID Debby Crosley, MD   25 mg at 02/05/11 0910  . ondansetron (ZOFRAN) tablet 4 mg  4 mg Oral Q6H PRN Debby Crosley, MD       Or  . ondansetron (ZOFRAN) injection 4 mg  4 mg Intravenous Q6H PRN Debby Crosley, MD      . pantoprazole (PROTONIX) EC tablet 40 mg  40 mg Oral QAC breakfast Debby Crosley, MD   40 mg at 02/05/11 0910  . zolpidem (AMBIEN) tablet 5 mg  5 mg Oral QHS PRN Gery Pray, MD      . DISCONTD: 0.9 %  sodium chloride infusion   Intravenous Continuous Benay Pomeroy Jarrett-Davis 75 mL/hr at 02/04/11 2148      . DISCONTD: diphenhydrAMINE (BENADRYL) injection 12.5 mg  12.5 mg Intravenous Q8H PRN Chenoah Mcnally Jarrett-Davis   12.5 mg at 02/05/11 1430    Assessment/Plan:  *Hiccups Improving  Hyponatremia Improved with hydration.  D/c IVF  Encephalopathy Resolved  Nausea & vomiting Resolved  Hypertension Metoprolol  Hypercholesteremia OP follow up Abnormal CT of Esophagus: GI consult in AM Itching Unsure of etiology, Benadryl PRN and IV solumedrol.    LOS: 2 days   Earlene Plater MD, [place] Pier 02/05/2011, 6:38 PM

## 2011-02-06 ENCOUNTER — Encounter (HOSPITAL_COMMUNITY)
Admission: EM | Disposition: A | Discharge status: Home / Self Care | Payer: Self-pay | Source: Home / Self Care | Attending: Internal Medicine

## 2011-02-06 LAB — CBC
MCHC: 34.6 g/dL (ref 30.0–36.0)
MCV: 94 fL (ref 78.0–100.0)
Platelets: 296 10*3/uL (ref 150–400)
RDW: 11.8 % (ref 11.5–15.5)
WBC: 6.8 10*3/uL (ref 4.0–10.5)

## 2011-02-06 LAB — BASIC METABOLIC PANEL
Chloride: 101 mEq/L (ref 96–112)
Creatinine, Ser: 1.09 mg/dL (ref 0.50–1.35)
GFR calc Af Amer: 83 mL/min — ABNORMAL LOW (ref >90)
GFR calc non Af Amer: 71 mL/min — ABNORMAL LOW (ref >90)

## 2011-02-06 SURGERY — EGD (ESOPHAGOGASTRODUODENOSCOPY)
Anesthesia: Moderate Sedation

## 2011-02-06 MED ORDER — METOPROLOL SUCCINATE ER 50 MG PO TB24: 50.0000 mg | ORAL_TABLET | Freq: Every day | ORAL | Status: DC

## 2011-02-06 MED ORDER — ZOLPIDEM TARTRATE 5 MG PO TABS: 5.0000 mg | ORAL_TABLET | Freq: Every evening | ORAL | Status: DC | PRN

## 2011-02-06 MED ORDER — PROMETHAZINE HCL 12.5 MG PO TABS: 12.5000 mg | ORAL_TABLET | Freq: Four times a day (QID) | ORAL | Status: AC | PRN

## 2011-02-06 MED ORDER — HYDROCODONE-ACETAMINOPHEN 5-325 MG PO TABS: 1.0000 | ORAL_TABLET | ORAL | Status: AC | PRN

## 2011-02-06 MED ORDER — PROMETHAZINE HCL 12.5 MG PO TABS: 12.5000 mg | ORAL_TABLET | Freq: Four times a day (QID) | ORAL | Status: DC | PRN

## 2011-02-06 NOTE — Discharge Summary (Signed)
DISCHARGE SUMMARY  Lawrence Scott  MR#: [id number]  DOB:04/15/1950  Date of Admission: 02/03/2011 Date of Discharge: 02/06/2011  Attending Physician:Davis MD, Ladell Pier  Patient's PCP: Dr Nilda Simmer  Consults:Treatment Team:  Carollee Massed  Discharge Diagnoses: .Hyponatremia .Encephalopathy .Hiccups Hypertension Generalized itching Abnormal esophagus seen on CT of the chest   Discharge Medication List as of 02/06/2011  1:28 PM    START taking these medications   Details  metoprolol (TOPROL XL) 50 MG 24 hr tablet Take 1 tablet (50 mg total) by mouth daily., Starting 02/06/2011, Until Tue 02/06/12, Normal    zolpidem (AMBIEN) 5 MG tablet Take 1 tablet (5 mg total) by mouth at bedtime as needed for sleep (insomnia)., Starting 02/06/2011, Until Tue 02/06/12, Print      CONTINUE these medications which have CHANGED   Details  promethazine (PHENERGAN) 12.5 MG tablet Take 1 tablet (12.5 mg total) by mouth every 6 (six) hours as needed for nausea., Starting 02/06/2011, Until Mon 02/13/11, Print      CONTINUE these medications which have NOT CHANGED   Details  calcium carbonate (TUMS - DOSED IN MG ELEMENTAL CALCIUM) 500 MG chewable tablet Chew 1 tablet by mouth as needed. HEARTBURN..., Until Discontinued, Historical Med      STOP taking these medications     thioridazine (MELLARIL) 25 MG tablet        HPI:  This is a unfortunate 60 y/o gentleman who was on vacation in the [place], he states he ate grouper fish at 5PM and by 8PM he had severe nausea, vomiting and diarrhea that led him to be hospitalized for 2 days in the [place]. He was finally airlifted out to a hospital in [facility]. By the time of his transfer his vomiting and diarrhea had resolved, he still has some nausea. He also has severe hiccups. This was treated with Mellaril without improvement. He had no hematemesis, today he had a bowel movement and on wiping noted a bloody smear on  the tissue. He reports some abdominal discomfort but mostly with the hiccups. He has no fever but his is constantly cold. He was discharged from the hospital in [place] after one day and flew home. He has continued not to feel well, not eating. His girlfriend who brought him to the ER today states he has also been confused and forgetful. History obtained from the patient who now appears alert and oriented but ill, as well as his girlfriend who is at the bedside.  Hospital Course: Marland KitchenHyponatremia Patient was admitted with hyponatremia most likely secondary to dehydration. He received IV fluids and hyponatremia has now resolved. Patient had nausea vomiting diarrhea secondary to food poisoning body within the bile, is. He now feels like he is progressively getting better. He will be discharged home he will follow up with his primary care physician.   .Encephalopathy Patient was noted to have some encephalopathy this was thought to be secondary to dehydration and electrolyte abnormality. That has since resolved.   Marland KitchenHiccups: Patient complains of intractable hiccups has been going on since he had to food poisoning. That has calmed down quite a bit with treatment with Thorazine and Reglan. In the workup for his hiccups he was noted to have an abnormal esophagus on the CT scan of the chest. GI was consulted Dr. Loreta Ave and was about to do a endoscopy. Patient stated that he does have a family friend outpatient  that is a gastroenterologist and he will follow up with him for the  upper endoscopy.  Hypertension: Patient was discharged on Toprol-XL 50 mg daily. He will follow up with his regular doctor to recheck his blood pressure. He had no chest pain or shortness of breath prior to discharge.   Day of Discharge BP 152/86  Pulse 86  Temp(Src) 99.3 F (37.4 C) (Axillary)  Resp 18  Ht 5\' 11"  (1.803 m)  Wt 96.3 kg (212 lb 4.9 oz)  BMI 29.61 kg/m2  SpO2 99%  Physical Exam: General: Patient appears his  stated age. HEENT: Head normocephalic atraumatic pupils reactive to light. Cardiovascular: Regular rate rhythm. Lungs: clear to auscultation bilaterally. Abdomen:Soft nontender nondistended positive bowel sounds. Extremities: no edema.  Results for orders placed during the hospital encounter of 02/03/11 (from the past 24 hour(s))  CBC     Status: Abnormal   Collection Time   02/06/11  5:20 AM      Component Value Range   WBC 6.8  4.0 - 10.5 (K/uL)   RBC 4.00 (*) 4.22 - 5.81 (MIL/uL)   Hemoglobin 13.0  13.0 - 17.0 (g/dL)   HCT 95.2 (*) 84.1 - 52.0 (%)   MCV 94.0  78.0 - 100.0 (fL)   MCH 32.5  26.0 - 34.0 (pg)   MCHC 34.6  30.0 - 36.0 (g/dL)   RDW 32.4  40.1 - 02.7 (%)   Platelets 296  150 - 400 (K/uL)  BASIC METABOLIC PANEL     Status: Abnormal   Collection Time   02/06/11  5:20 AM      Component Value Range   Sodium 136  135 - 145 (mEq/L)   Potassium 4.0  3.5 - 5.1 (mEq/L)   Chloride 101  96 - 112 (mEq/L)   CO2 26  19 - 32 (mEq/L)   Glucose, Bld 139 (*) 70 - 99 (mg/dL)   BUN 10  6 - 23 (mg/dL)   Creatinine, Ser 2.53  0.50 - 1.35 (mg/dL)   Calcium 9.2  8.4 - 66.4 (mg/dL)   GFR calc non Af Amer 71 (*) >90 (mL/min)   GFR calc Af Amer 83 (*) >90 (mL/min)    Disposition: Patient to followup with his primary care physician Dr. Thornton Dales within one week.  Follow-up Appts: Discharge Orders    Future Orders Please Complete By Expires   Diet - low sodium heart healthy      Increase activity slowly            Tests Needing Follow-up: EGD  Signed: Earlene Plater MD, Ladell Pier 02/06/2011, 6:35 PM

## 2011-02-06 NOTE — Progress Notes (Signed)
UR completed 

## 2011-02-08 LAB — OPIATE, QUANTITATIVE, URINE
Hydrocodone: 2236 NG/ML — ABNORMAL HIGH
Morphine, Confirm: NEGATIVE NG/ML

## 2011-02-10 LAB — CULTURE, BLOOD (ROUTINE X 2)
Culture  Setup Time: 201211101115
Culture: NO GROWTH

## 2014-11-14 DIAGNOSIS — Z23 Encounter for immunization: Secondary | ICD-10-CM | POA: Diagnosis not present

## 2015-01-22 DIAGNOSIS — Z0000 Encounter for general adult medical examination without abnormal findings: Secondary | ICD-10-CM | POA: Diagnosis not present

## 2015-01-22 DIAGNOSIS — M109 Gout, unspecified: Secondary | ICD-10-CM | POA: Diagnosis not present

## 2015-01-22 DIAGNOSIS — Z0184 Encounter for antibody response examination: Secondary | ICD-10-CM | POA: Diagnosis not present

## 2015-01-22 DIAGNOSIS — Z125 Encounter for screening for malignant neoplasm of prostate: Secondary | ICD-10-CM | POA: Diagnosis not present

## 2015-01-22 DIAGNOSIS — I10 Essential (primary) hypertension: Secondary | ICD-10-CM | POA: Diagnosis not present

## 2015-01-22 DIAGNOSIS — E785 Hyperlipidemia, unspecified: Secondary | ICD-10-CM | POA: Diagnosis not present

## 2015-01-22 DIAGNOSIS — K219 Gastro-esophageal reflux disease without esophagitis: Secondary | ICD-10-CM | POA: Diagnosis not present

## 2015-01-22 DIAGNOSIS — Z23 Encounter for immunization: Secondary | ICD-10-CM | POA: Diagnosis not present

## 2015-03-05 DIAGNOSIS — R739 Hyperglycemia, unspecified: Secondary | ICD-10-CM | POA: Diagnosis not present

## 2015-03-05 DIAGNOSIS — E559 Vitamin D deficiency, unspecified: Secondary | ICD-10-CM | POA: Diagnosis not present

## 2015-03-05 DIAGNOSIS — E871 Hypo-osmolality and hyponatremia: Secondary | ICD-10-CM | POA: Diagnosis not present

## 2015-06-11 DIAGNOSIS — H2513 Age-related nuclear cataract, bilateral: Secondary | ICD-10-CM | POA: Diagnosis not present

## 2015-06-11 DIAGNOSIS — H5203 Hypermetropia, bilateral: Secondary | ICD-10-CM | POA: Diagnosis not present

## 2015-06-11 DIAGNOSIS — H43813 Vitreous degeneration, bilateral: Secondary | ICD-10-CM | POA: Diagnosis not present

## 2015-06-28 DIAGNOSIS — M722 Plantar fascial fibromatosis: Secondary | ICD-10-CM | POA: Diagnosis not present

## 2015-08-09 ENCOUNTER — Ambulatory Visit
Admission: RE | Admit: 2015-08-09 | Discharge: 2015-08-09 | Disposition: A | Discharge status: Home / Self Care | Payer: Medicare Other | Source: Ambulatory Visit | Attending: Family | Admitting: Family

## 2015-08-09 ENCOUNTER — Other Ambulatory Visit: Payer: Self-pay | Admitting: Family

## 2015-08-09 DIAGNOSIS — M79671 Pain in right foot: Secondary | ICD-10-CM

## 2015-08-09 DIAGNOSIS — Z79899 Other long term (current) drug therapy: Secondary | ICD-10-CM | POA: Diagnosis not present

## 2015-08-09 DIAGNOSIS — Z8739 Personal history of other diseases of the musculoskeletal system and connective tissue: Secondary | ICD-10-CM | POA: Diagnosis not present

## 2015-08-09 DIAGNOSIS — M19071 Primary osteoarthritis, right ankle and foot: Secondary | ICD-10-CM | POA: Diagnosis not present

## 2015-08-24 DIAGNOSIS — S99821A Other specified injuries of right foot, initial encounter: Secondary | ICD-10-CM | POA: Diagnosis not present

## 2015-08-24 DIAGNOSIS — M722 Plantar fascial fibromatosis: Secondary | ICD-10-CM | POA: Diagnosis not present

## 2015-08-24 DIAGNOSIS — M7731 Calcaneal spur, right foot: Secondary | ICD-10-CM | POA: Diagnosis not present

## 2015-08-24 DIAGNOSIS — M79671 Pain in right foot: Secondary | ICD-10-CM | POA: Diagnosis not present

## 2015-09-13 DIAGNOSIS — M7731 Calcaneal spur, right foot: Secondary | ICD-10-CM | POA: Diagnosis not present

## 2015-09-13 DIAGNOSIS — M79671 Pain in right foot: Secondary | ICD-10-CM | POA: Diagnosis not present

## 2015-09-13 DIAGNOSIS — M722 Plantar fascial fibromatosis: Secondary | ICD-10-CM | POA: Diagnosis not present

## 2016-02-14 DIAGNOSIS — Z23 Encounter for immunization: Secondary | ICD-10-CM | POA: Diagnosis not present

## 2016-07-03 DIAGNOSIS — M109 Gout, unspecified: Secondary | ICD-10-CM | POA: Diagnosis not present

## 2016-07-03 DIAGNOSIS — M542 Cervicalgia: Secondary | ICD-10-CM | POA: Diagnosis not present

## 2016-09-13 DIAGNOSIS — E785 Hyperlipidemia, unspecified: Secondary | ICD-10-CM | POA: Diagnosis not present

## 2016-09-13 DIAGNOSIS — M79672 Pain in left foot: Secondary | ICD-10-CM | POA: Diagnosis not present

## 2016-09-13 DIAGNOSIS — M79671 Pain in right foot: Secondary | ICD-10-CM | POA: Diagnosis not present

## 2016-09-13 DIAGNOSIS — R8290 Unspecified abnormal findings in urine: Secondary | ICD-10-CM | POA: Diagnosis not present

## 2016-09-13 DIAGNOSIS — I10 Essential (primary) hypertension: Secondary | ICD-10-CM | POA: Diagnosis not present

## 2017-02-10 DIAGNOSIS — Z23 Encounter for immunization: Secondary | ICD-10-CM | POA: Diagnosis not present

## 2017-03-27 HISTORY — PX: COLONOSCOPY: SHX174

## 2017-05-01 DIAGNOSIS — E785 Hyperlipidemia, unspecified: Secondary | ICD-10-CM | POA: Diagnosis not present

## 2017-05-01 DIAGNOSIS — M79672 Pain in left foot: Secondary | ICD-10-CM | POA: Diagnosis not present

## 2017-05-01 DIAGNOSIS — G479 Sleep disorder, unspecified: Secondary | ICD-10-CM | POA: Diagnosis not present

## 2017-05-01 DIAGNOSIS — I10 Essential (primary) hypertension: Secondary | ICD-10-CM | POA: Diagnosis not present

## 2017-05-01 DIAGNOSIS — M79671 Pain in right foot: Secondary | ICD-10-CM | POA: Diagnosis not present

## 2017-06-02 DIAGNOSIS — H10023 Other mucopurulent conjunctivitis, bilateral: Secondary | ICD-10-CM | POA: Diagnosis not present

## 2017-06-06 DIAGNOSIS — R739 Hyperglycemia, unspecified: Secondary | ICD-10-CM | POA: Diagnosis not present

## 2017-06-27 DIAGNOSIS — Z1211 Encounter for screening for malignant neoplasm of colon: Secondary | ICD-10-CM | POA: Diagnosis not present

## 2017-11-20 DIAGNOSIS — E785 Hyperlipidemia, unspecified: Secondary | ICD-10-CM | POA: Diagnosis not present

## 2017-11-20 DIAGNOSIS — I10 Essential (primary) hypertension: Secondary | ICD-10-CM | POA: Diagnosis not present

## 2017-12-18 DIAGNOSIS — E785 Hyperlipidemia, unspecified: Secondary | ICD-10-CM | POA: Diagnosis not present

## 2017-12-18 DIAGNOSIS — Z23 Encounter for immunization: Secondary | ICD-10-CM | POA: Diagnosis not present

## 2017-12-18 DIAGNOSIS — J209 Acute bronchitis, unspecified: Secondary | ICD-10-CM | POA: Diagnosis not present

## 2017-12-18 DIAGNOSIS — I10 Essential (primary) hypertension: Secondary | ICD-10-CM | POA: Diagnosis not present

## 2018-04-02 DIAGNOSIS — E785 Hyperlipidemia, unspecified: Secondary | ICD-10-CM | POA: Diagnosis not present

## 2018-04-02 DIAGNOSIS — J310 Chronic rhinitis: Secondary | ICD-10-CM | POA: Diagnosis not present

## 2018-04-02 DIAGNOSIS — I10 Essential (primary) hypertension: Secondary | ICD-10-CM | POA: Diagnosis not present

## 2018-11-29 DIAGNOSIS — Z79899 Other long term (current) drug therapy: Secondary | ICD-10-CM | POA: Diagnosis not present

## 2018-11-29 DIAGNOSIS — E785 Hyperlipidemia, unspecified: Secondary | ICD-10-CM | POA: Diagnosis not present

## 2018-11-29 DIAGNOSIS — I10 Essential (primary) hypertension: Secondary | ICD-10-CM | POA: Diagnosis not present

## 2018-11-29 DIAGNOSIS — Z23 Encounter for immunization: Secondary | ICD-10-CM | POA: Diagnosis not present

## 2019-04-18 DIAGNOSIS — Z23 Encounter for immunization: Secondary | ICD-10-CM | POA: Diagnosis not present

## 2019-04-18 DIAGNOSIS — I10 Essential (primary) hypertension: Secondary | ICD-10-CM | POA: Diagnosis not present

## 2019-04-18 DIAGNOSIS — E785 Hyperlipidemia, unspecified: Secondary | ICD-10-CM | POA: Diagnosis not present

## 2019-04-18 DIAGNOSIS — N400 Enlarged prostate without lower urinary tract symptoms: Secondary | ICD-10-CM | POA: Diagnosis not present

## 2019-06-02 ENCOUNTER — Emergency Department (HOSPITAL_COMMUNITY): Payer: Medicare Other

## 2019-06-02 ENCOUNTER — Emergency Department (HOSPITAL_COMMUNITY)
Admission: EM | Admit: 2019-06-02 | Discharge: 2019-06-02 | Disposition: A | Discharge status: Home / Self Care | Payer: Medicare Other | Attending: Emergency Medicine | Admitting: Emergency Medicine

## 2019-06-02 ENCOUNTER — Encounter (HOSPITAL_COMMUNITY): Payer: Self-pay | Admitting: Emergency Medicine

## 2019-06-02 DIAGNOSIS — I10 Essential (primary) hypertension: Secondary | ICD-10-CM | POA: Diagnosis not present

## 2019-06-02 DIAGNOSIS — K5732 Diverticulitis of large intestine without perforation or abscess without bleeding: Secondary | ICD-10-CM | POA: Diagnosis not present

## 2019-06-02 DIAGNOSIS — K5792 Diverticulitis of intestine, part unspecified, without perforation or abscess without bleeding: Secondary | ICD-10-CM

## 2019-06-02 DIAGNOSIS — R079 Chest pain, unspecified: Secondary | ICD-10-CM | POA: Diagnosis not present

## 2019-06-02 DIAGNOSIS — Z79899 Other long term (current) drug therapy: Secondary | ICD-10-CM | POA: Insufficient documentation

## 2019-06-02 DIAGNOSIS — R109 Unspecified abdominal pain: Secondary | ICD-10-CM | POA: Diagnosis not present

## 2019-06-02 LAB — CBC
HCT: 38.2 % — ABNORMAL LOW (ref 39.0–52.0)
Hemoglobin: 12.5 g/dL — ABNORMAL LOW (ref 13.0–17.0)
MCH: 32.2 pg (ref 26.0–34.0)
MCHC: 32.7 g/dL (ref 30.0–36.0)
MCV: 98.5 fL (ref 80.0–100.0)
Platelets: 239 10*3/uL (ref 150–400)
RBC: 3.88 MIL/uL — ABNORMAL LOW (ref 4.22–5.81)
RDW: 12 % (ref 11.5–15.5)
WBC: 13 10*3/uL — ABNORMAL HIGH (ref 4.0–10.5)
nRBC: 0 % (ref 0.0–0.2)

## 2019-06-02 LAB — URINALYSIS, ROUTINE W REFLEX MICROSCOPIC
Bilirubin Urine: NEGATIVE
Glucose, UA: NEGATIVE mg/dL
Hgb urine dipstick: NEGATIVE
Ketones, ur: NEGATIVE mg/dL
Leukocytes,Ua: NEGATIVE
Nitrite: NEGATIVE
Protein, ur: NEGATIVE mg/dL
Specific Gravity, Urine: 1.009 (ref 1.005–1.030)
pH: 6 (ref 5.0–8.0)

## 2019-06-02 LAB — COMPREHENSIVE METABOLIC PANEL
ALT: 22 U/L (ref 0–44)
AST: 23 U/L (ref 15–41)
Albumin: 4.2 g/dL (ref 3.5–5.0)
Alkaline Phosphatase: 53 U/L (ref 38–126)
Anion gap: 7 (ref 5–15)
BUN: 18 mg/dL (ref 8–23)
CO2: 25 mmol/L (ref 22–32)
Calcium: 8.7 mg/dL — ABNORMAL LOW (ref 8.9–10.3)
Chloride: 105 mmol/L (ref 98–111)
Creatinine, Ser: 0.97 mg/dL (ref 0.61–1.24)
GFR calc Af Amer: >60 mL/min (ref >60)
GFR calc non Af Amer: >60 mL/min (ref >60)
Glucose, Bld: 117 mg/dL — ABNORMAL HIGH (ref 70–99)
Potassium: 3.4 mmol/L — ABNORMAL LOW (ref 3.5–5.1)
Sodium: 137 mmol/L (ref 135–145)
Total Bilirubin: 0.6 mg/dL (ref 0.3–1.2)
Total Protein: 7.6 g/dL (ref 6.5–8.1)

## 2019-06-02 LAB — LIPASE, BLOOD: Lipase: 25 U/L (ref 11–51)

## 2019-06-02 LAB — TROPONIN I (HIGH SENSITIVITY)
Troponin I (High Sensitivity): 2 ng/L (ref <18)
Troponin I (High Sensitivity): 3 ng/L (ref <18)

## 2019-06-02 MED ORDER — METRONIDAZOLE 500 MG PO TABS: 500.0000 mg | ORAL_TABLET | Freq: Two times a day (BID) | ORAL | 0 refills | Status: DC

## 2019-06-02 MED ORDER — ONDANSETRON 4 MG PO TBDP: 4.0000 mg | ORAL_TABLET | Freq: Three times a day (TID) | ORAL | 0 refills | Status: DC | PRN

## 2019-06-02 MED ORDER — SODIUM CHLORIDE 0.9% FLUSH
3.0000 mL | Freq: Once | INTRAVENOUS | Status: AC
  Administered 2019-06-02: 3 mL via INTRAVENOUS

## 2019-06-02 MED ORDER — CIPROFLOXACIN HCL 500 MG PO TABS: 500.0000 mg | ORAL_TABLET | Freq: Two times a day (BID) | ORAL | 0 refills | Status: AC

## 2019-06-02 MED ORDER — SODIUM CHLORIDE (PF) 0.9 % IJ SOLN
INTRAMUSCULAR | Status: AC
  Filled 2019-06-02: qty 50

## 2019-06-02 MED ORDER — IOHEXOL 350 MG/ML SOLN
100.0000 mL | Freq: Once | INTRAVENOUS | Status: AC | PRN
  Administered 2019-06-02: 100 mL via INTRAVENOUS

## 2019-06-02 MED ORDER — CIPROFLOXACIN HCL 500 MG PO TABS: 500.0000 mg | ORAL_TABLET | Freq: Two times a day (BID) | ORAL | 0 refills | Status: DC

## 2019-06-02 MED ORDER — METRONIDAZOLE 500 MG PO TABS: 500.0000 mg | ORAL_TABLET | Freq: Two times a day (BID) | ORAL | 0 refills | Status: AC

## 2019-06-02 NOTE — ED Triage Notes (Signed)
Pt complaint of abdominal pain/distention onset 0100 this am. Denies CP but verbalizes SOB "from stiffness and pain in my lower stomach; I think I have a bad case of gas."

## 2019-06-02 NOTE — ED Notes (Signed)
Patient transported to CT 

## 2019-06-02 NOTE — Discharge Instructions (Signed)
Return if any problems.  See your Physician for recheck in 3-4 days  ?

## 2019-06-02 NOTE — ED Notes (Signed)
Informed pt we need a urine specimen and he stated he does not have to use the bathroom at the moment

## 2019-06-03 DIAGNOSIS — K5712 Diverticulitis of small intestine without perforation or abscess without bleeding: Secondary | ICD-10-CM | POA: Diagnosis not present

## 2019-06-03 NOTE — ED Provider Notes (Signed)
[facility]-EMERGENCY DEPT Provider Note   CSN: [id number] Arrival date & time: 06/02/19  1011     History Chief Complaint  Patient presents with  . Abdominal Pain    Lawrence Scott is a 69 y.o. male.  Pt reports his abdomen feels swollen, Pt reports he felt some tightness in his chest.  He states he thinks it was gas from the bloating   The history is provided by the patient. No language interpreter was used.  Abdominal Pain Pain location:  Generalized Pain quality: aching   Pain radiates to:  Does not radiate Pain severity:  Moderate Onset quality:  Gradual Timing:  Constant Progression:  Worsening Chronicity:  New Relieved by:  Nothing Worsened by:  Nothing Ineffective treatments:  None tried Associated symptoms: no nausea   Risk factors: no NSAID use        Past Medical History:  Diagnosis Date  . High cholesterol   . Hypertension     Patient Active Problem List   Diagnosis Date Noted  . Hyponatremia 02/04/2011  . Encephalopathy 02/04/2011  . Nausea & vomiting 02/04/2011  . Hypertension 02/04/2011  . Hypercholesteremia 02/04/2011  . Hiccups 02/04/2011    History reviewed. No pertinent surgical history.     Family History  Problem Relation Age of Onset  . Lung cancer Other     Social History   Tobacco Use  . Smoking status: Never Smoker  Substance Use Topics  . Alcohol use: Yes    Alcohol/week: 1.0 standard drinks    Types: 1 drink(s) per week    Comment: one mixed drink before bed   . Drug use: No    Home Medications Prior to Admission medications   Medication Sig Start Date End Date Taking? Authorizing Provider  calcium carbonate (TUMS - DOSED IN MG ELEMENTAL CALCIUM) 500 MG chewable tablet Chew 1 tablet by mouth as needed for indigestion or heartburn. HEARTBURN...   Yes [provider]  lisinopril-hydrochlorothiazide (ZESTORETIC) 20-25 MG tablet Take 1 tablet by mouth daily. 04/19/19  Yes [provider]  simvastatin (ZOCOR) 40 MG tablet Take 40 mg by mouth at bedtime. 04/19/19  Yes [provider]  Thiamine HCl (VITAMIN B-1) 250 MG tablet Take 250 mg by mouth daily.   Yes [provider]  ciprofloxacin (CIPRO) 500 MG tablet Take 1 tablet (500 mg total) by mouth 2 (two) times daily for 10 days. 06/02/19 06/12/19  Elson Areas, PA-C  metoprolol (TOPROL XL) 50 MG 24 hr tablet Take 1 tablet (50 mg total) by mouth daily. 02/06/11 02/06/12  Alinda Money, MD  metroNIDAZOLE (FLAGYL) 500 MG tablet Take 1 tablet (500 mg total) by mouth 2 (two) times daily for 10 days. 06/02/19 06/12/19  Elson Areas, PA-C  ondansetron (ZOFRAN ODT) 4 MG disintegrating tablet Take 1 tablet (4 mg total) by mouth every 8 (eight) hours as needed for nausea or vomiting. 06/02/19   Elson Areas, PA-C  zolpidem (AMBIEN) 5 MG tablet Take 1 tablet (5 mg total) by mouth at bedtime as needed for sleep (insomnia). 02/06/11 02/06/12  Alinda Money, MD    Allergies    Patient has no known allergies.  Review of Systems   Review of Systems  Gastrointestinal: Positive for abdominal pain. Negative for nausea.  All other systems reviewed and are negative.   Physical Exam Updated Vital Signs BP 140/79   Pulse 84   Temp 98.2 F (36.8 C) (Oral)   Resp  18   Ht 5\' 11"  (1.803 m)   Wt 94.8 kg   SpO2 99%   BMI 29.15 kg/m   Physical Exam Vitals and nursing note reviewed.  Constitutional:      Appearance: He is well-developed.  HENT:     Head: Normocephalic and atraumatic.  Eyes:     Conjunctiva/sclera: Conjunctivae normal.  Cardiovascular:     Rate and Rhythm: Normal rate and regular rhythm.     Heart sounds: No murmur.  Pulmonary:     Effort: Pulmonary effort is normal. No respiratory distress.     Breath sounds: Normal breath sounds.  Abdominal:     General: Abdomen is flat. Bowel sounds are normal.     Palpations: Abdomen is soft.     Tenderness: There is no abdominal tenderness.    Musculoskeletal:     Cervical back: Neck supple.  Skin:    General: Skin is warm and dry.  Neurological:     General: No focal deficit present.     Mental Status: He is alert.     ED Results / Procedures / Treatments   Labs (all labs ordered are listed, but only abnormal results are displayed) Labs Reviewed  COMPREHENSIVE METABOLIC PANEL - Abnormal; Notable for the following components:      Result Value   Potassium 3.4 (*)    Glucose, Bld 117 (*)    Calcium 8.7 (*)    All other components within normal limits  CBC - Abnormal; Notable for the following components:   WBC 13.0 (*)    RBC 3.88 (*)    Hemoglobin 12.5 (*)    HCT 38.2 (*)    All other components within normal limits  LIPASE, BLOOD  URINALYSIS, ROUTINE W REFLEX MICROSCOPIC  TROPONIN I (HIGH SENSITIVITY)  TROPONIN I (HIGH SENSITIVITY)    EKG EKG Interpretation  Date/Time:  Monday June 02 2019 12:06:28 EST Ventricular Rate:  80 PR Interval:    QRS Duration: 91 QT Interval:  393 QTC Calculation: 454 R Axis:   -9 Text Interpretation: Sinus rhythm No old tracing to compare Confirmed by [id number] [phone]) on 06/02/2019 12:25:59 PM   Radiology DG Chest Port 1 View  Result Date: 06/02/2019 CLINICAL DATA:  Chest pain. EXAM: PORTABLE CHEST 1 VIEW COMPARISON:  February 04, 2011. FINDINGS: Stable cardiomediastinal silhouette. No pneumothorax or pleural effusion is noted. Lungs are clear. Bony thorax is unremarkable. IMPRESSION: No active disease. Electronically Signed   By: February 06, 2011 M.D.   On: 06/02/2019 12:19   CT Angio Chest/Abd/Pel for Dissection W and/or Wo Contrast  Result Date: 06/02/2019 CLINICAL DATA:  Chest pain and abdominal pain for several hours EXAM: CT ANGIOGRAPHY CHEST, ABDOMEN AND PELVIS TECHNIQUE: Multidetector CT imaging through the chest, abdomen and pelvis was performed using the standard protocol during bolus administration of intravenous contrast. Multiplanar reconstructed images and MIPs  were obtained and reviewed to evaluate the vascular anatomy. CONTRAST:  08/02/2019 OMNIPAQUE IOHEXOL 350 MG/ML SOLN COMPARISON:  Chest x-ray from earlier in the same day. FINDINGS: CTA CHEST FINDINGS Cardiovascular: Precontrast images show no hyperdense crescent within the thoracic aorta. The thoracic aorta and its branches are widely patent. Mild atherosclerotic calcifications are noted as well as coronary calcifications. No aneurysmal dilatation or findings of dissection are seen. No cardiac enlargement is noted. The pulmonary artery as visualized is within normal limits although not timed for embolus evaluation. Mediastinum/Nodes: Thoracic inlet is within normal limits. No sizable hilar or mediastinal adenopathy is noted. The  esophagus is within normal limits. Lungs/Pleura: Mild dependent atelectatic changes are noted in the lungs bilaterally. No focal infiltrate or sizable effusion is seen. No pneumothorax is noted. Musculoskeletal: No chest wall abnormality. No acute or significant osseous findings. Review of the MIP images confirms the above findings. CTA ABDOMEN AND PELVIS FINDINGS VASCULAR Aorta: Abdominal aorta demonstrates mild atherosclerotic calcifications without aneurysmal dilatation or focal dissection. Celiac: Normal celiac axis is not identified. There are dual origins of the common hepatic artery and splenic artery from the aorta with the left gastric arising from the splenic just beyond its origin. These are widely patent without focal stenosis. SMA: Patent without evidence of aneurysm, dissection, vasculitis or significant stenosis. Renals: Single right renal artery is noted and widely patent. Dual renal arteries are noted on the left without focal stenosis. IMA: Patent without evidence of aneurysm, dissection, vasculitis or significant stenosis. Iliacs: Patent without evidence of aneurysm, dissection, vasculitis or significant stenosis. Veins: No obvious venous abnormality within the limitations of  this arterial phase study. Review of the MIP images confirms the above findings. NON-VASCULAR Hepatobiliary: No focal liver abnormality is seen. No gallstones, gallbladder wall thickening, or biliary dilatation. Pancreas: Unremarkable. No pancreatic ductal dilatation or surrounding inflammatory changes. Spleen: Normal in size without focal abnormality. Adrenals/Urinary Tract: Adrenal glands are unremarkable. Kidneys are normal, without renal calculi, focal lesion, or hydronephrosis. Bladder is unremarkable. Stomach/Bowel: The appendix is well visualized without inflammatory change. It does however extend mildly into the right inguinal canal. Colon demonstrates no obstructive or inflammatory changes. The stomach is within normal limits. Small bowel demonstrates multiple diverticula. The largest of these lie in the proximal to mid jejunum in the left mid abdomen with some surrounding inflammatory change consistent with small bowel diverticulitis. This is best visualized from images 128-142 of series 7. A few small adjacent lymph nodes are noted as well. Lymphatic: Small mesenteric lymph nodes are noted reactive to the diverticulitis of the small bowel. Reproductive: Prostate is unremarkable. Other: No abdominal wall hernia or abnormality. No abdominopelvic ascites. Musculoskeletal: No acute or significant osseous findings. Review of the MIP images confirms the above findings. IMPRESSION: CTA of the chest: No aneurysmal dilatation or dissection is identified. CTA of the abdomen and pelvis: Changes consistent with small bowel diverticulitis in the mid jejunum. Variant anatomy of the celiac axis as described. No other focal abnormality is noted. Electronically Signed   By: Alcide Clever M.D.   On: 06/02/2019 15:33    Procedures Procedures (including critical care time)  Medications Ordered in ED Medications  sodium chloride flush (NS) 0.9 % injection 3 mL (3 mLs Intravenous Given by Other 06/02/19 1630)  iohexol  (OMNIPAQUE) 350 MG/ML injection 100 mL (100 mLs Intravenous Contrast Given 06/02/19 1507)    ED Course  I have reviewed the triage vital signs and the nursing notes.  Pertinent labs & imaging results that were available during my care of the patient were reviewed by me and considered in my medical decision making (see chart for details).    MDM Rules/Calculators/A&P                      MDM:  Dr. Adela Lank in to see and exam.  Ct scan shows diverticulitis.  Pt counseled on results and given rx fro cipro and flagyl.the patient advised to see his Md for recheck in 3-4 days.  Pt is advised to return to Ed if symptoms worsen or change Final Clinical Impression(s) / ED Diagnoses Final  diagnoses:  Diverticulitis    Rx / DC Orders ED Discharge Orders         Ordered    ciprofloxacin (CIPRO) 500 MG tablet  2 times daily,   Status:  Discontinued     06/02/19 1624    metroNIDAZOLE (FLAGYL) 500 MG tablet  2 times daily,   Status:  Discontinued     06/02/19 1624    ondansetron (ZOFRAN ODT) 4 MG disintegrating tablet  Every 8 hours PRN,   Status:  Discontinued     06/02/19 1624    ondansetron (ZOFRAN ODT) 4 MG disintegrating tablet  Every 8 hours PRN     06/02/19 1703    ciprofloxacin (CIPRO) 500 MG tablet  2 times daily     06/02/19 1703    metroNIDAZOLE (FLAGYL) 500 MG tablet  2 times daily     06/02/19 1703        An After Visit Summary was printed and given to the patient.    Fransico Meadow, PA-C 06/03/19 [place], [place], DO 06/03/19 [phone]

## 2019-06-10 DIAGNOSIS — H25811 Combined forms of age-related cataract, right eye: Secondary | ICD-10-CM | POA: Diagnosis not present

## 2019-06-10 DIAGNOSIS — H25812 Combined forms of age-related cataract, left eye: Secondary | ICD-10-CM | POA: Diagnosis not present

## 2019-09-12 DIAGNOSIS — Z03818 Encounter for observation for suspected exposure to other biological agents ruled out: Secondary | ICD-10-CM | POA: Diagnosis not present

## 2019-09-12 DIAGNOSIS — R05 Cough: Secondary | ICD-10-CM | POA: Diagnosis not present

## 2020-01-07 DIAGNOSIS — Z23 Encounter for immunization: Secondary | ICD-10-CM | POA: Diagnosis not present

## 2020-01-12 DIAGNOSIS — E785 Hyperlipidemia, unspecified: Secondary | ICD-10-CM | POA: Diagnosis not present

## 2020-01-12 DIAGNOSIS — I10 Essential (primary) hypertension: Secondary | ICD-10-CM | POA: Diagnosis not present

## 2020-01-15 DIAGNOSIS — Z23 Encounter for immunization: Secondary | ICD-10-CM | POA: Diagnosis not present

## 2020-02-03 DIAGNOSIS — M79671 Pain in right foot: Secondary | ICD-10-CM | POA: Diagnosis not present

## 2020-02-03 DIAGNOSIS — M79672 Pain in left foot: Secondary | ICD-10-CM | POA: Diagnosis not present

## 2020-03-27 HISTORY — PX: ESOPHAGOGASTRODUODENOSCOPY: SHX1529

## 2020-05-19 DIAGNOSIS — K219 Gastro-esophageal reflux disease without esophagitis: Secondary | ICD-10-CM | POA: Diagnosis not present

## 2020-05-19 DIAGNOSIS — I10 Essential (primary) hypertension: Secondary | ICD-10-CM | POA: Diagnosis not present

## 2020-05-19 DIAGNOSIS — M109 Gout, unspecified: Secondary | ICD-10-CM | POA: Diagnosis not present

## 2020-05-19 DIAGNOSIS — Z8719 Personal history of other diseases of the digestive system: Secondary | ICD-10-CM | POA: Diagnosis not present

## 2020-05-19 DIAGNOSIS — E785 Hyperlipidemia, unspecified: Secondary | ICD-10-CM | POA: Diagnosis not present

## 2020-05-19 DIAGNOSIS — R739 Hyperglycemia, unspecified: Secondary | ICD-10-CM | POA: Diagnosis not present

## 2020-05-19 DIAGNOSIS — D649 Anemia, unspecified: Secondary | ICD-10-CM | POA: Diagnosis not present

## 2020-05-27 DIAGNOSIS — I10 Essential (primary) hypertension: Secondary | ICD-10-CM | POA: Diagnosis not present

## 2020-05-27 DIAGNOSIS — J189 Pneumonia, unspecified organism: Secondary | ICD-10-CM | POA: Diagnosis not present

## 2020-05-27 DIAGNOSIS — Z20822 Contact with and (suspected) exposure to covid-19: Secondary | ICD-10-CM | POA: Diagnosis not present

## 2020-05-28 DIAGNOSIS — S39011A Strain of muscle, fascia and tendon of abdomen, initial encounter: Secondary | ICD-10-CM | POA: Diagnosis not present

## 2020-05-28 DIAGNOSIS — M940 Chondrocostal junction syndrome [Tietze]: Secondary | ICD-10-CM | POA: Diagnosis not present

## 2020-06-14 DIAGNOSIS — R42 Dizziness and giddiness: Secondary | ICD-10-CM | POA: Diagnosis not present

## 2020-06-14 DIAGNOSIS — R55 Syncope and collapse: Secondary | ICD-10-CM | POA: Diagnosis not present

## 2020-06-14 DIAGNOSIS — R0602 Shortness of breath: Secondary | ICD-10-CM | POA: Diagnosis not present

## 2020-07-19 DIAGNOSIS — H524 Presbyopia: Secondary | ICD-10-CM | POA: Diagnosis not present

## 2020-07-19 DIAGNOSIS — K2270 Barrett's esophagus without dysplasia: Secondary | ICD-10-CM | POA: Diagnosis not present

## 2020-07-19 DIAGNOSIS — Z0100 Encounter for examination of eyes and vision without abnormal findings: Secondary | ICD-10-CM | POA: Diagnosis not present

## 2020-07-20 DIAGNOSIS — I10 Essential (primary) hypertension: Secondary | ICD-10-CM | POA: Diagnosis not present

## 2020-08-24 DIAGNOSIS — E785 Hyperlipidemia, unspecified: Secondary | ICD-10-CM | POA: Diagnosis not present

## 2020-08-24 DIAGNOSIS — R7303 Prediabetes: Secondary | ICD-10-CM | POA: Diagnosis not present

## 2020-08-24 DIAGNOSIS — I10 Essential (primary) hypertension: Secondary | ICD-10-CM | POA: Diagnosis not present

## 2020-08-24 DIAGNOSIS — M109 Gout, unspecified: Secondary | ICD-10-CM | POA: Diagnosis not present

## 2020-08-26 DIAGNOSIS — H9201 Otalgia, right ear: Secondary | ICD-10-CM | POA: Diagnosis not present

## 2020-09-06 DIAGNOSIS — I10 Essential (primary) hypertension: Secondary | ICD-10-CM | POA: Diagnosis not present

## 2020-09-06 DIAGNOSIS — E785 Hyperlipidemia, unspecified: Secondary | ICD-10-CM | POA: Diagnosis not present

## 2020-09-06 DIAGNOSIS — N400 Enlarged prostate without lower urinary tract symptoms: Secondary | ICD-10-CM | POA: Diagnosis not present

## 2020-09-06 DIAGNOSIS — K219 Gastro-esophageal reflux disease without esophagitis: Secondary | ICD-10-CM | POA: Diagnosis not present

## 2020-10-05 DIAGNOSIS — H6990 Unspecified Eustachian tube disorder, unspecified ear: Secondary | ICD-10-CM | POA: Diagnosis not present

## 2020-10-05 DIAGNOSIS — I10 Essential (primary) hypertension: Secondary | ICD-10-CM | POA: Diagnosis not present

## 2020-10-07 DIAGNOSIS — K2930 Chronic superficial gastritis without bleeding: Secondary | ICD-10-CM | POA: Diagnosis not present

## 2020-10-07 DIAGNOSIS — K3189 Other diseases of stomach and duodenum: Secondary | ICD-10-CM | POA: Diagnosis not present

## 2020-10-07 DIAGNOSIS — K2270 Barrett's esophagus without dysplasia: Secondary | ICD-10-CM | POA: Diagnosis not present

## 2020-10-13 DIAGNOSIS — K2270 Barrett's esophagus without dysplasia: Secondary | ICD-10-CM | POA: Diagnosis not present

## 2020-10-13 DIAGNOSIS — K2930 Chronic superficial gastritis without bleeding: Secondary | ICD-10-CM | POA: Diagnosis not present

## 2020-11-17 DIAGNOSIS — E785 Hyperlipidemia, unspecified: Secondary | ICD-10-CM | POA: Diagnosis not present

## 2020-11-17 DIAGNOSIS — I10 Essential (primary) hypertension: Secondary | ICD-10-CM | POA: Diagnosis not present

## 2020-11-17 DIAGNOSIS — K219 Gastro-esophageal reflux disease without esophagitis: Secondary | ICD-10-CM | POA: Diagnosis not present

## 2020-11-17 DIAGNOSIS — N400 Enlarged prostate without lower urinary tract symptoms: Secondary | ICD-10-CM | POA: Diagnosis not present

## 2021-01-24 DIAGNOSIS — K219 Gastro-esophageal reflux disease without esophagitis: Secondary | ICD-10-CM | POA: Diagnosis not present

## 2021-01-24 DIAGNOSIS — N400 Enlarged prostate without lower urinary tract symptoms: Secondary | ICD-10-CM | POA: Diagnosis not present

## 2021-01-24 DIAGNOSIS — I10 Essential (primary) hypertension: Secondary | ICD-10-CM | POA: Diagnosis not present

## 2021-01-24 DIAGNOSIS — E785 Hyperlipidemia, unspecified: Secondary | ICD-10-CM | POA: Diagnosis not present

## 2021-01-31 DIAGNOSIS — Z125 Encounter for screening for malignant neoplasm of prostate: Secondary | ICD-10-CM | POA: Diagnosis not present

## 2021-01-31 DIAGNOSIS — Z0000 Encounter for general adult medical examination without abnormal findings: Secondary | ICD-10-CM | POA: Diagnosis not present

## 2021-02-09 IMAGING — CT CT ANGIO CHEST-ABD-PELV FOR DISSECTION W/ AND WO/W CM
2 of 7 series · 13 of 46 positions shown, 15 images · IV contrast (APPLIED)
Comparison: Chest x-ray from earlier in the same day.

CLINICAL DATA: Chest pain and abdominal pain for several hours

EXAM:
CT ANGIOGRAPHY CHEST, ABDOMEN AND PELVIS
TECHNIQUE: Multidetector CT imaging through the chest, abdomen and pelvis was
performed using the standard protocol during bolus administration of
intravenous contrast. Multiplanar reconstructed images and MIPs were
obtained and reviewed to evaluate the vascular anatomy.
CONTRAST:  100mL OMNIPAQUE IOHEXOL 350 MG/ML SOLN

[Series 7: axial arterial · axial · arterial · 0.80mm/px · z∈[-619,-34]mm · 10 of 225 slices shown, 12 images]
[im 15/225  soft-tissue]
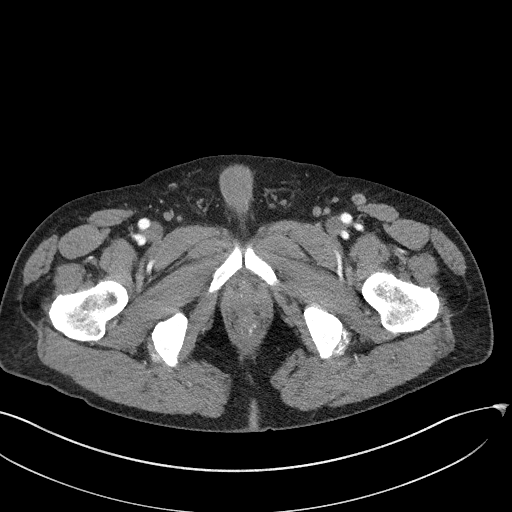
[im 15/225  bone]
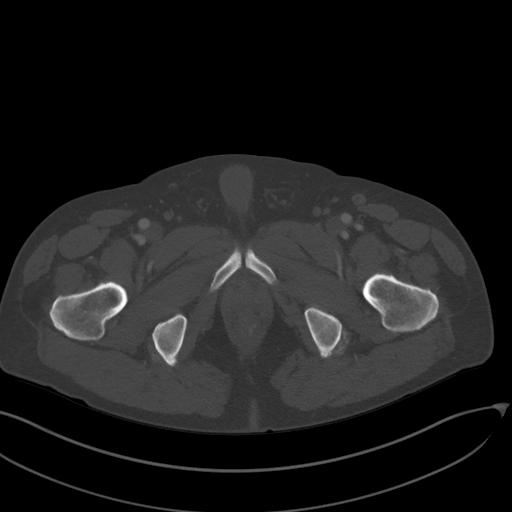
[im 45/225  soft-tissue]
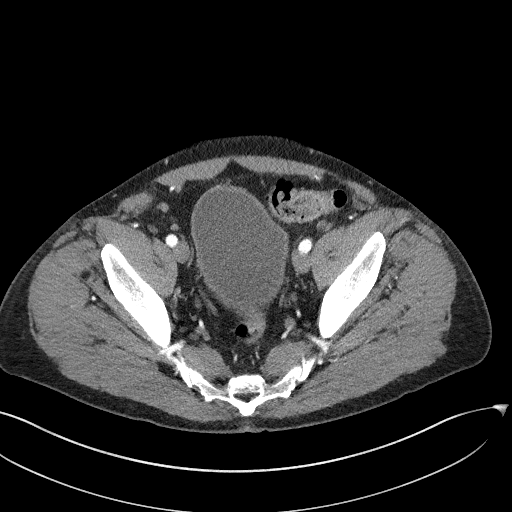
[im 60/225  soft-tissue]
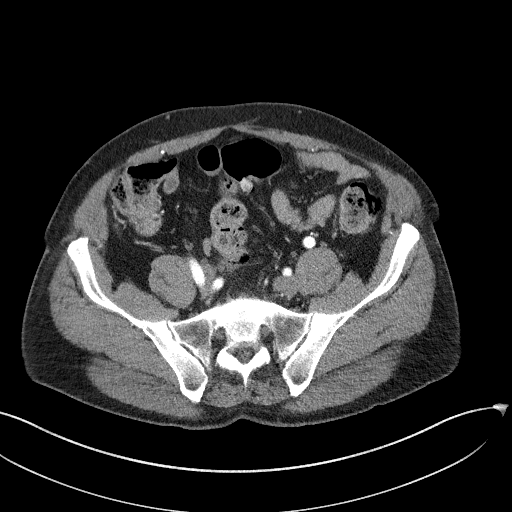
[im 75/225  soft-tissue]
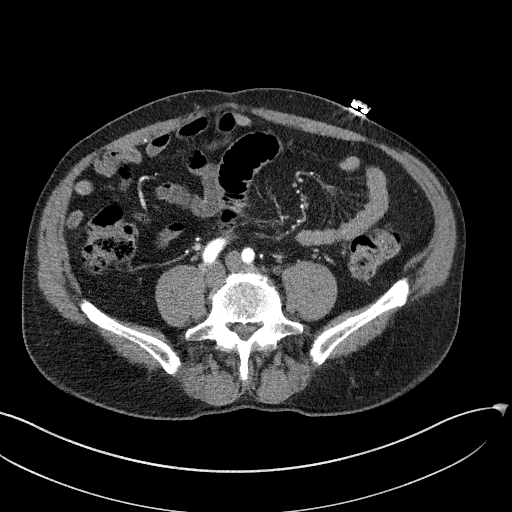
[im 105/225  soft-tissue]
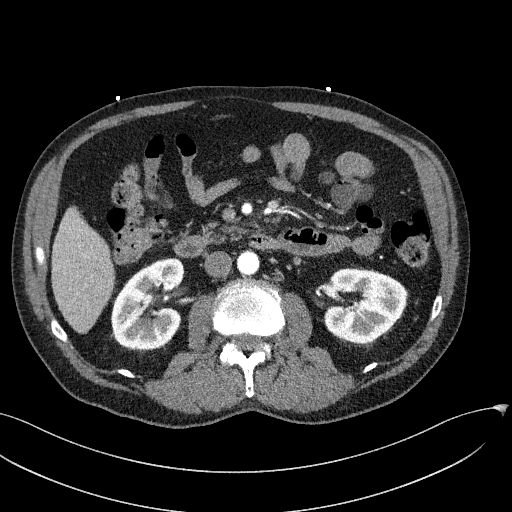
[im 120/225  soft-tissue]
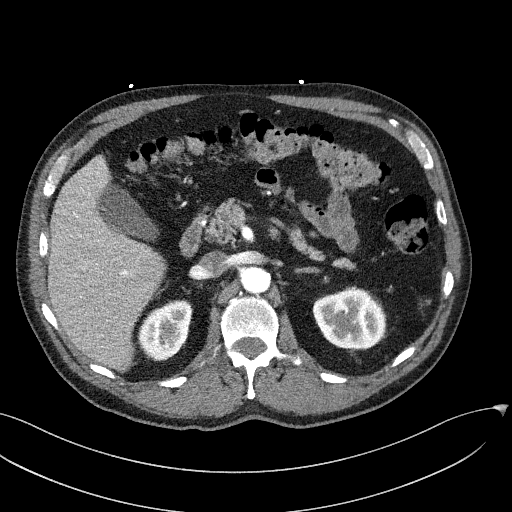
[im 150/225  soft-tissue]
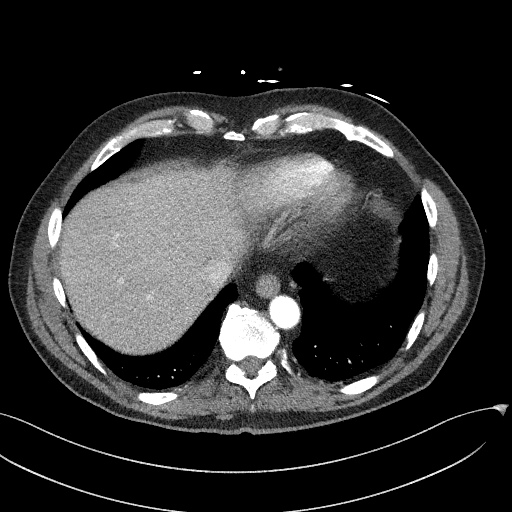
[im 165/225  soft-tissue]
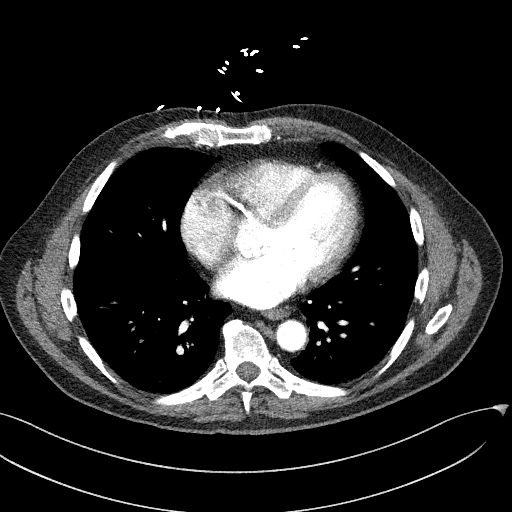
[im 180/225  soft-tissue]
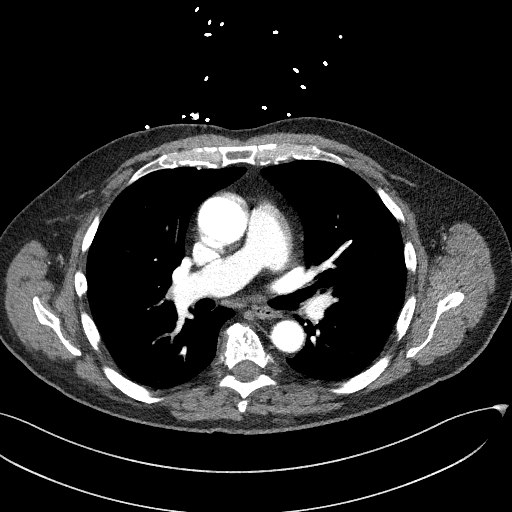
[im 180/225  bone]
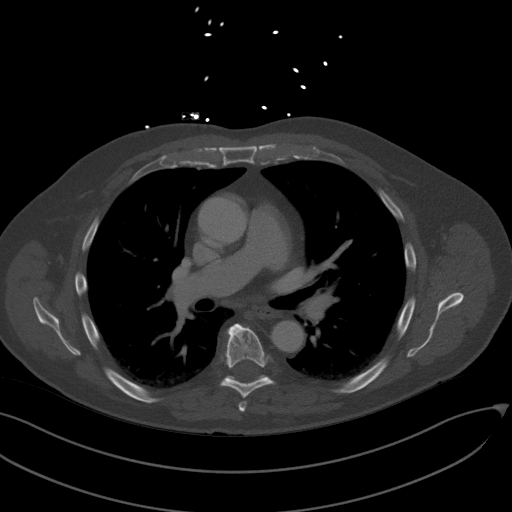
[im 210/225  soft-tissue]
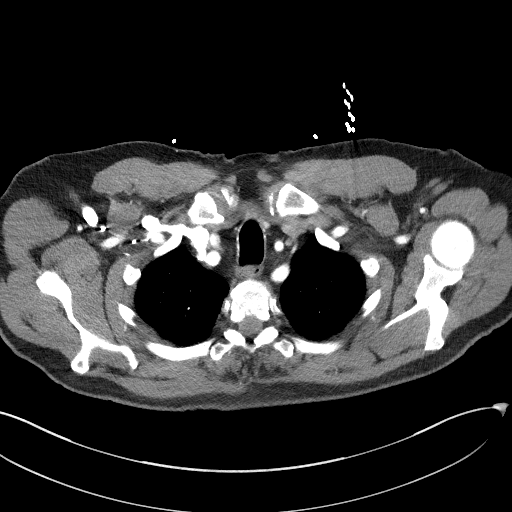

[Series 10: coronals · coronal · 0.87mm/px · 3 of 157 slices shown]
[im 40/157  soft-tissue]
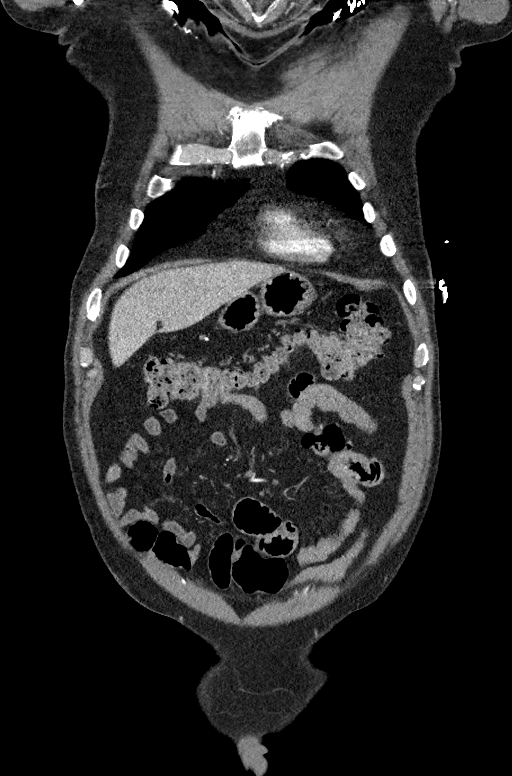
[im 79/157  soft-tissue]
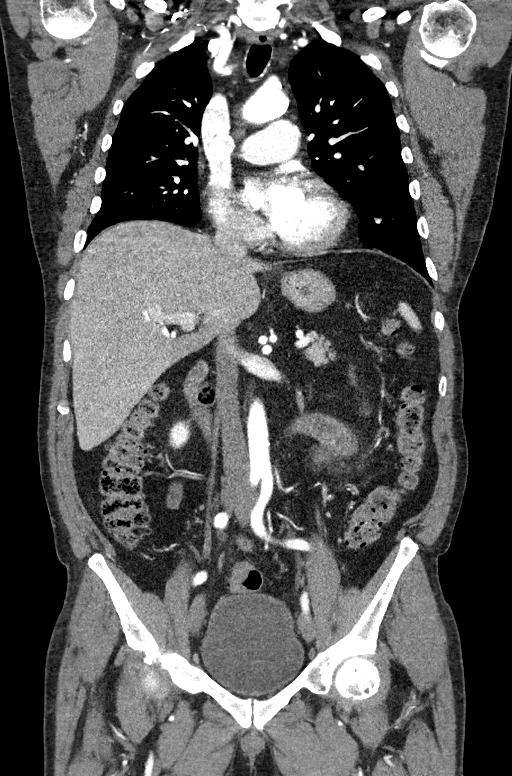
[im 118/157  soft-tissue]
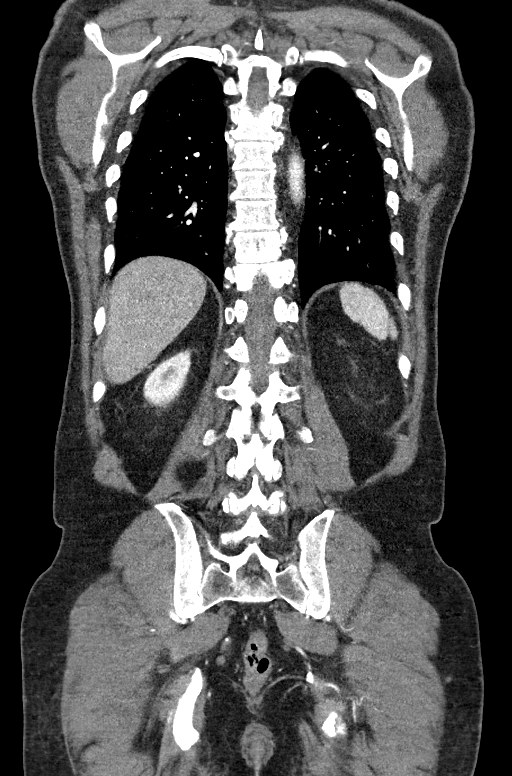

[13 of 46 positions shown; findings below may reference images not displayed]

FINDINGS: CTA CHEST FINDINGS

Cardiovascular: Precontrast images show no hyperdense crescent
within the thoracic aorta. The thoracic aorta and its branches are
widely patent. Mild atherosclerotic calcifications are noted as well
as coronary calcifications. No aneurysmal dilatation or findings of
dissection are seen. No cardiac enlargement is noted. The pulmonary
artery as visualized is within normal limits although not timed for
embolus evaluation.

Mediastinum/Nodes: Thoracic inlet is within normal limits. No
sizable hilar or mediastinal adenopathy is noted. The esophagus is
within normal limits.

Lungs/Pleura: Mild dependent atelectatic changes are noted in the
lungs bilaterally. No focal infiltrate or sizable effusion is seen.
No pneumothorax is noted.

Musculoskeletal: No chest wall abnormality. No acute or significant
osseous findings.

Review of the MIP images confirms the above findings.

CTA ABDOMEN AND PELVIS FINDINGS

VASCULAR

Aorta: Abdominal aorta demonstrates mild atherosclerotic
calcifications without aneurysmal dilatation or focal dissection.

Celiac: Normal celiac axis is not identified. There are dual origins
of the common hepatic artery and splenic artery from the aorta with
the left gastric arising from the splenic just beyond its origin.
These are widely patent without focal stenosis.

SMA: Patent without evidence of aneurysm, dissection, vasculitis or
significant stenosis.

Renals: Single right renal artery is noted and widely patent. Dual
renal arteries are noted on the left without focal stenosis.

IMA: Patent without evidence of aneurysm, dissection, vasculitis or
significant stenosis.

Iliacs: Patent without evidence of aneurysm, dissection, vasculitis
or significant stenosis.

Veins: No obvious venous abnormality within the limitations of this
arterial phase study.

Review of the MIP images confirms the above findings.

NON-VASCULAR

Hepatobiliary: No focal liver abnormality is seen. No gallstones,
gallbladder wall thickening, or biliary dilatation.

Pancreas: Unremarkable. No pancreatic ductal dilatation or
surrounding inflammatory changes.

Spleen: Normal in size without focal abnormality.

Adrenals/Urinary Tract: Adrenal glands are unremarkable. Kidneys are
normal, without renal calculi, focal lesion, or hydronephrosis.
Bladder is unremarkable.

Stomach/Bowel: The appendix is well visualized without inflammatory
change. It does however extend mildly into the right inguinal canal.
Colon demonstrates no obstructive or inflammatory changes. The
stomach is within normal limits. Small bowel demonstrates multiple
diverticula. The largest of these lie in the proximal to mid jejunum
in the left mid abdomen with some surrounding inflammatory change
consistent with small bowel diverticulitis. This is best visualized
from images 128-142 of series 7. A few small adjacent lymph nodes
are noted as well.

Lymphatic: Small mesenteric lymph nodes are noted reactive to the
diverticulitis of the small bowel.

Reproductive: Prostate is unremarkable.

Other: No abdominal wall hernia or abnormality. No abdominopelvic
ascites.

Musculoskeletal: No acute or significant osseous findings.

Review of the MIP images confirms the above findings.
IMPRESSION: CTA of the chest: No aneurysmal dilatation or dissection is
identified.

CTA of the abdomen and pelvis: Changes consistent with small bowel
diverticulitis in the mid jejunum.

Variant anatomy of the celiac axis as described.

No other focal abnormality is noted.

## 2021-02-16 DIAGNOSIS — R972 Elevated prostate specific antigen [PSA]: Secondary | ICD-10-CM | POA: Diagnosis not present

## 2021-06-17 DIAGNOSIS — H02102 Unspecified ectropion of right lower eyelid: Secondary | ICD-10-CM | POA: Diagnosis not present

## 2021-06-17 DIAGNOSIS — H25013 Cortical age-related cataract, bilateral: Secondary | ICD-10-CM | POA: Diagnosis not present

## 2021-06-17 DIAGNOSIS — H524 Presbyopia: Secondary | ICD-10-CM | POA: Diagnosis not present

## 2021-06-17 DIAGNOSIS — H2513 Age-related nuclear cataract, bilateral: Secondary | ICD-10-CM | POA: Diagnosis not present

## 2021-07-21 DIAGNOSIS — H25011 Cortical age-related cataract, right eye: Secondary | ICD-10-CM | POA: Diagnosis not present

## 2021-07-21 DIAGNOSIS — H25811 Combined forms of age-related cataract, right eye: Secondary | ICD-10-CM | POA: Diagnosis not present

## 2021-07-21 DIAGNOSIS — H2511 Age-related nuclear cataract, right eye: Secondary | ICD-10-CM | POA: Diagnosis not present

## 2021-07-26 ENCOUNTER — Ambulatory Visit (INDEPENDENT_AMBULATORY_CARE_PROVIDER_SITE_OTHER): Payer: Medicare HMO | Admitting: Orthopaedic Surgery

## 2021-07-26 ENCOUNTER — Ambulatory Visit (INDEPENDENT_AMBULATORY_CARE_PROVIDER_SITE_OTHER): Payer: Medicare HMO

## 2021-07-26 ENCOUNTER — Encounter: Payer: Self-pay | Admitting: Orthopaedic Surgery

## 2021-07-26 DIAGNOSIS — M545 Low back pain, unspecified: Secondary | ICD-10-CM

## 2021-07-26 DIAGNOSIS — M79672 Pain in left foot: Secondary | ICD-10-CM

## 2021-07-26 NOTE — Progress Notes (Signed)
? ?Office Visit Note ?  ?Patient: Lawrence Scott           ?Date of Birth: 02-Sep-1950           ?MRN: [id number] ?Visit Date: 07/26/2021 ?             ?Requested by: [name], MD ?[address] ?[place],  [place] [address] ?PCP: [name], MD ? ?Chief Complaint  ?Patient presents with  ? Left Foot - Pain  ? ? ? ? ?HPI: ?Patient comes in today with a several month history of left lateral forefoot pain.  Denies any injury.  Hurts him when he has been walking on his foot for a while.  Denies any previous difficulty with his foot.  Also complaining of a more recent issue of low back pain that radiates into his right buttock.  He denies injuring any injury there.  He does think when he came back home from a work assignment he is on a softer mattress not sure if that is what caused it or the fact that he was sleeping on his back because he had cataract surgery.  Denies any paresthesia or loss of bowel or bladder control ? ?Assessment & Plan: ?Visit Diagnoses:  ?1. Pain in left foot   ?2. Midline low back pain, unspecified chronicity, unspecified whether sciatica present   ? ? ?Plan: With regards to the foot his x-rays overall are normal.  He does have quite a bit of callusing and probably bursitis at the fifth MTP joint.  He also has some fat atrophy in the foot.  Would recommend good inserts which he already has as well as a stiff soled shoe. ?With regards to his back Dr. 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  The first place to start would be to try some dedicated back exercises we gave him information on this today.  Discussed the importance of core strengthening and maintaining that.  If he had continued problems or if it got worse we would recommend an MRI and he can call us to arrange that ? ?Follow-Up Instructions: No follow-ups on file.  ? ?Ortho Exam ? ?Patient is alert, oriented, no adenopathy, well-dressed, normal affect, normal respiratory  effort. ?Left foot he has a palpable dorsalis pedis pulse he does have quite a bit of varicosities.  Slight pronation.  Discussed no redness no cellulitis.  He does have a thickened callus beneath the fifth MTP with some pain may be consistent with a little bursitis.  At the fifth MTP.  Brisk capillary refill no breakdown in skin.  Nontender through the midfoot joint no tenderness over the heel range of motion is fairly good ?Lower back he has pain focally is in the lower back after he sits for a while he gets up and says it extends into his posterior buttock.  He has 5 out of 5 strength.  He is able to feel his toes and denies any paresthesias ? ?Imaging: ?XR Foot Complete Left ? ?Result Date: 07/26/2021 ?Imaging of his left foot was reviewed today well-maintained alignment to the tarsometatarsal joints.  He does have an os perineum.  Also calcaneal spur consistent with chronic plantar fasciitis.  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 ?XR Lumbar Spine 2-3 Views ? ?Result Date: 07/26/2021 ?Radiographs of his lumbar spine demonstrate no listhesis.  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  He does have osteophytes no acute fractures.  Most degeneration and joint space narrowing is L5-S1  ?No images are attached to the encounter. ? ?Labs: ?Lab Results  ?Component Value Date  ? REPTSTATUS 02/10/2011 FINAL 02/04/2011  ? REPTSTATUS 02/10/2011 FINAL 02/04/2011  ? GRAMSTAIN  08/13/2010  ?  FEW WBC PRESENT,BOTH PMN AND MONONUCLEAR ?RARE SQUAMOUS EPITHELIAL CELLS PRESENT ?NO ORGANISMS SEEN  ? CULT NO GROWTH 5 DAYS 02/04/2011  ? CULT NO GROWTH 5 DAYS 02/04/2011  ? ? ? ?Lab Results  ?Component Value Date  ? ALBUMIN 4.2 06/02/2019  ? ALBUMIN 3.7 02/04/2011  ? ALBUMIN 3.9 02/04/2011  ? ? ?No results found for: MG ?No results found for: VD25OH ? ?No results found for: PREALBUMIN ? ?  Latest Ref Rng & Units 06/02/2019  ? 10:24 AM 02/06/2011  ?  5:20 AM 02/05/2011   ?  5:30 AM  ?CBC EXTENDED  ?WBC 4.0 - 10.5 K/uL 13.0   6.8   7.4    ?RBC 4.22 - 5.81 MIL/uL 3.88   4.00   3.86    ?Hemoglobin 13.0 - 17.0 g/dL 95.2   84.1   32.4    ?HCT 39.0 - 52.0 % 38.2   37.6   36.7    ?Platelets 150 - 400 K/uL 239   296   251    ? ? ? ?There is no height or weight on file to calculate BMI. ? ?Orders:  ?Orders Placed This Encounter  ?Procedures  ? XR Foot Complete Left  ? XR Lumbar Spine 2-3 Views  ? ?No orders of the defined types were placed in this encounter. ? ? ? Procedures: ?No procedures performed ? ?Clinical Data: ?No additional findings. ? ?ROS: ? ?All other systems negative, except as noted in the HPI. ?Review of Systems ? ?Objective: ?Vital Signs: There were no vitals taken for this visit. ? ?Specialty Comments:  ?No specialty comments available. ? ?PMFS History: ?Patient Active Problem List  ? Diagnosis Date Noted  ? Hyponatremia 02/04/2011  ? Encephalopathy 02/04/2011  ? Nausea & vomiting 02/04/2011  ? Hypertension 02/04/2011  ? Hypercholesteremia 02/04/2011  ? Hiccups 02/04/2011  ? ?Past Medical History:  ?Diagnosis Date  ? High cholesterol   ? Hypertension   ?  ?Family History  ?Problem Relation Age of Onset  ? Lung cancer Other   ?  ?History reviewed. No pertinent surgical history. ?Social History  ? ?Occupational History  ? Not on file  ?Tobacco Use  ? Smoking status: Never  ? Smokeless tobacco: Not on file  ?Substance and Sexual Activity  ? Alcohol use: Yes  ?  Alcohol/week: 1.0 standard drink  ?  Types: 1 drink(s) per week  ?  Comment: one mixed drink before bed   ? Drug use: No  ? Sexual activity: Not on file  ? ? ? ? ? ?

## 2021-07-28 DIAGNOSIS — E785 Hyperlipidemia, unspecified: Secondary | ICD-10-CM | POA: Diagnosis not present

## 2021-07-28 DIAGNOSIS — I10 Essential (primary) hypertension: Secondary | ICD-10-CM | POA: Diagnosis not present

## 2021-07-28 DIAGNOSIS — R972 Elevated prostate specific antigen [PSA]: Secondary | ICD-10-CM | POA: Diagnosis not present

## 2021-07-28 DIAGNOSIS — R7303 Prediabetes: Secondary | ICD-10-CM | POA: Diagnosis not present

## 2021-08-04 DIAGNOSIS — H25812 Combined forms of age-related cataract, left eye: Secondary | ICD-10-CM | POA: Diagnosis not present

## 2021-08-04 DIAGNOSIS — H2512 Age-related nuclear cataract, left eye: Secondary | ICD-10-CM | POA: Diagnosis not present

## 2021-08-04 DIAGNOSIS — H25012 Cortical age-related cataract, left eye: Secondary | ICD-10-CM | POA: Diagnosis not present

## 2021-08-25 HISTORY — PX: CATARACT EXTRACTION W/ INTRAOCULAR LENS IMPLANT: SHX1309

## 2021-11-15 DIAGNOSIS — M79671 Pain in right foot: Secondary | ICD-10-CM | POA: Diagnosis not present

## 2021-11-15 DIAGNOSIS — R972 Elevated prostate specific antigen [PSA]: Secondary | ICD-10-CM | POA: Diagnosis not present

## 2021-11-15 DIAGNOSIS — M79672 Pain in left foot: Secondary | ICD-10-CM | POA: Diagnosis not present

## 2021-11-15 DIAGNOSIS — M109 Gout, unspecified: Secondary | ICD-10-CM | POA: Diagnosis not present

## 2021-11-16 ENCOUNTER — Encounter: Payer: Self-pay | Admitting: Orthopaedic Surgery

## 2021-11-16 ENCOUNTER — Ambulatory Visit: Payer: Medicare HMO | Admitting: Orthopaedic Surgery

## 2021-11-16 ENCOUNTER — Ambulatory Visit: Payer: Self-pay

## 2021-11-16 DIAGNOSIS — M19071 Primary osteoarthritis, right ankle and foot: Secondary | ICD-10-CM | POA: Insufficient documentation

## 2021-11-16 DIAGNOSIS — M19072 Primary osteoarthritis, left ankle and foot: Secondary | ICD-10-CM | POA: Diagnosis not present

## 2021-11-16 DIAGNOSIS — M25571 Pain in right ankle and joints of right foot: Secondary | ICD-10-CM | POA: Diagnosis not present

## 2021-11-16 NOTE — Progress Notes (Signed)
Office Visit Note   Patient: Lawrence Scott           Date of Birth: Aug 06, 1950           MRN: [id number] Visit Date: 11/16/2021              Requested by: [name], MD [address] [place],  [place] [address] PCP: [name], MD   Assessment & Plan: Visit Diagnoses:  1. Pain in right ankle and joints of right foot   2. Arthritis of both feet     Plan: Patient is a pleasant 71 year old gentleman with a several week history of bilateral foot pain.  Denies any particular injury.  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  When he first arises in the morning he has some stiffness in his feet but as the day progresses this gets more painful.  He describes the pain as throbbing.  Denies any paresthesias.  By the end of his day his feet are sore and he takes an Advil at night.  He has a remote history of gout but has not had a problem in several years.  He did visit with his primary care physician yesterday who suggested going up on his Advil to 2 pills a day.  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  On the left foot he does have some midfoot arthritis though not quite as bad.  We discussed with him the natural history of this.  He has gotten some sketcher shoes which helped a little bit.  We talked about the importance of supporting his arch with good arch supports.  Talked about gave him information about Voltaren gel which he can use on his feet.  We also discussed that he could take up to 2 Advil 3 times a day as long as he does this with food.  This is still not a prescription dose.  May follow-up as needed  Follow-Up Instructions: Return if symptoms worsen or fail to improve.   Orders:  Orders Placed This Encounter  Procedures   XR Foot Complete Right   No orders of the defined types were placed in this encounter.     Procedures: No procedures performed   Clinical Data: No additional  findings.   Subjective: Chief Complaint  Patient presents with   Right Foot - Follow-up   Left Foot - Follow-up   Patient presents today for bilateral foot pain. He states that he has been having gradual ongoing foot pain for around 4-5 weeks. Patient denies having any injury or fall to have started the pain. He states that he has noticed increased pain first thing in the morning and then it gradually gets worse throughout the day. At this time he is currently taking OTC advil for pain. Patient is not a diabetic.   Occ: construction   Review of Systems  All other systems reviewed and are negative.    Objective: Vital Signs: There were no vitals taken for this visit.  Physical Exam Constitutional:      Appearance: Normal appearance.  Neurological:     Mental Status: He is alert.     Ortho Exam Examination bilateral feet he has mild soft tissue swelling varicosities bilaterally.  Pulses are palpable dorsalis pedis.  He has brisk capillary refill in his toes and his toes are warm and pink no cellulitis Right foot: He does have some tenderness  with manipulation of the midfoot over the midfoot joints.  Sensation is intact he also is very stiff over the first MTP joint and has very little motion.  He has good dorsiflexion plantarflexion.  Some mild stiffness in the ankle Left foot: Again tenderness more in the midfoot.  He has good motion of the toes and the first MTP good plantarflexion dorsiflexion sensation is intact Specialty Comments:  No specialty comments available.  Imaging: No results found.   PMFS History: Patient Active Problem List   Diagnosis Date Noted   Arthritis of both feet 11/16/2021   Hyponatremia 02/04/2011   Encephalopathy 02/04/2011   Nausea & vomiting 02/04/2011   Hypertension 02/04/2011   Hypercholesteremia 02/04/2011   Hiccups 02/04/2011   Past Medical History:  Diagnosis Date   High cholesterol    Hypertension     Family History  Problem  Relation Age of Onset   Lung cancer Other     History reviewed. No pertinent surgical history. Social History   Occupational History   Not on file  Tobacco Use   Smoking status: Never   Smokeless tobacco: Not on file  Substance and Sexual Activity   Alcohol use: Yes    Alcohol/week: 1.0 standard drink of alcohol    Types: 1 drink(s) per week    Comment: one mixed drink before bed    Drug use: No   Sexual activity: Not on file

## 2021-12-03 ENCOUNTER — Other Ambulatory Visit: Payer: Self-pay

## 2021-12-03 ENCOUNTER — Emergency Department (HOSPITAL_COMMUNITY)
Admission: EM | Admit: 2021-12-03 | Discharge: 2021-12-03 | Disposition: A | Discharge status: Home / Self Care | Payer: Medicare HMO | Attending: Emergency Medicine | Admitting: Emergency Medicine

## 2021-12-03 ENCOUNTER — Encounter (HOSPITAL_COMMUNITY): Payer: Self-pay

## 2021-12-03 ENCOUNTER — Emergency Department (HOSPITAL_BASED_OUTPATIENT_CLINIC_OR_DEPARTMENT_OTHER): Payer: Medicare HMO

## 2021-12-03 DIAGNOSIS — M7989 Other specified soft tissue disorders: Secondary | ICD-10-CM

## 2021-12-03 DIAGNOSIS — M79605 Pain in left leg: Secondary | ICD-10-CM | POA: Diagnosis not present

## 2021-12-03 DIAGNOSIS — Z79899 Other long term (current) drug therapy: Secondary | ICD-10-CM | POA: Diagnosis not present

## 2021-12-03 DIAGNOSIS — M79604 Pain in right leg: Secondary | ICD-10-CM

## 2021-12-03 DIAGNOSIS — I10 Essential (primary) hypertension: Secondary | ICD-10-CM | POA: Insufficient documentation

## 2021-12-03 DIAGNOSIS — R600 Localized edema: Secondary | ICD-10-CM | POA: Diagnosis not present

## 2021-12-03 DIAGNOSIS — M79609 Pain in unspecified limb: Secondary | ICD-10-CM | POA: Diagnosis not present

## 2021-12-03 DIAGNOSIS — M79671 Pain in right foot: Secondary | ICD-10-CM | POA: Diagnosis not present

## 2021-12-03 LAB — CBC WITH DIFFERENTIAL/PLATELET
Abs Immature Granulocytes: 0.02 10*3/uL (ref 0.00–0.07)
Basophils Absolute: 0 10*3/uL (ref 0.0–0.1)
Basophils Relative: 1 %
Eosinophils Absolute: 0.1 10*3/uL (ref 0.0–0.5)
Eosinophils Relative: 1 %
HCT: 38.3 % — ABNORMAL LOW (ref 39.0–52.0)
Hemoglobin: 12.7 g/dL — ABNORMAL LOW (ref 13.0–17.0)
Immature Granulocytes: 0 %
Lymphocytes Relative: 27 %
Lymphs Abs: 1.9 10*3/uL (ref 0.7–4.0)
MCH: 32.5 pg (ref 26.0–34.0)
MCHC: 33.2 g/dL (ref 30.0–36.0)
MCV: 98 fL (ref 80.0–100.0)
Monocytes Absolute: 0.7 10*3/uL (ref 0.1–1.0)
Monocytes Relative: 9 %
Neutro Abs: 4.5 10*3/uL (ref 1.7–7.7)
Neutrophils Relative %: 62 %
Platelets: 262 10*3/uL (ref 150–400)
RBC: 3.91 MIL/uL — ABNORMAL LOW (ref 4.22–5.81)
RDW: 12.8 % (ref 11.5–15.5)
WBC: 7.2 10*3/uL (ref 4.0–10.5)
nRBC: 0 % (ref 0.0–0.2)

## 2021-12-03 LAB — BASIC METABOLIC PANEL
Anion gap: 7 (ref 5–15)
BUN: 22 mg/dL (ref 8–23)
CO2: 25 mmol/L (ref 22–32)
Calcium: 9.3 mg/dL (ref 8.9–10.3)
Chloride: 109 mmol/L (ref 98–111)
Creatinine, Ser: 1.2 mg/dL (ref 0.61–1.24)
GFR, Estimated: >60 mL/min (ref >60)
Glucose, Bld: 102 mg/dL — ABNORMAL HIGH (ref 70–99)
Potassium: 3.4 mmol/L — ABNORMAL LOW (ref 3.5–5.1)
Sodium: 141 mmol/L (ref 135–145)

## 2021-12-03 LAB — BRAIN NATRIURETIC PEPTIDE: B Natriuretic Peptide: 21.4 pg/mL (ref 0.0–100.0)

## 2021-12-03 MED ORDER — PREDNISONE 50 MG PO TABS: 50.0000 mg | ORAL_TABLET | Freq: Every day | ORAL | 0 refills | Status: DC

## 2021-12-03 MED ORDER — SODIUM CHLORIDE 0.9 % IV BOLUS
1000.0000 mL | Freq: Once | INTRAVENOUS | Status: AC
  Administered 2021-12-03: 1000 mL via INTRAVENOUS

## 2021-12-03 MED ORDER — GABAPENTIN 300 MG PO CAPS: 300.0000 mg | ORAL_CAPSULE | Freq: Every day | ORAL | 0 refills | Status: DC

## 2021-12-03 MED ORDER — KETOROLAC TROMETHAMINE 30 MG/ML IJ SOLN
30.0000 mg | Freq: Once | INTRAMUSCULAR | Status: AC
  Administered 2021-12-03: 30 mg via INTRAVENOUS
  Filled 2021-12-03: qty 1

## 2021-12-03 MED ORDER — DEXAMETHASONE SODIUM PHOSPHATE 10 MG/ML IJ SOLN
10.0000 mg | Freq: Once | INTRAMUSCULAR | Status: AC
  Administered 2021-12-03: 10 mg via INTRAVENOUS
  Filled 2021-12-03: qty 1

## 2021-12-03 NOTE — ED Triage Notes (Addendum)
Reports swelling to bilateral feet and was told by PCP it was his arthritis  Reports its worse when walking around.  Skin is taut.  Reports they are not as swollen as they usually are .  Reports having trouble x 1 month but was told by PCP to come get evaluated by ER. Complains of bilateral calf pain, but did drive from [facility] yesterday to here.

## 2021-12-03 NOTE — ED Provider Notes (Signed)
[facility] EMERGENCY DEPARTMENT Provider Note   CSN: [id number] Arrival date & time: 12/03/21  1522     History  Chief Complaint  Patient presents with   Foot Swelling    Lawrence Scott is a 71 y.o. male.  Pt is a 71 yo male with a pmhx significant for high cholesterol and htn.  Pt has been having bilateral foot pain for the past month.  He said they get swollen.  The bottom of his feet hurt.  He did see ortho (Dr. Cleophas Dunker) on 8/23.  Pt was put on voltaren gel and ibuprofen, but pain is worse.  Pt said his right leg is slightly more swollen than the left today.  He drove from [place] yesterday.  Pt denies cp or sob.       Home Medications Prior to Admission medications   Medication Sig Start Date End Date Taking? Authorizing Provider  gabapentin (NEURONTIN) 300 MG capsule Take 1 capsule (300 mg total) by mouth at bedtime. 12/03/21  Yes Jacalyn Lefevre, MD  predniSONE (DELTASONE) 50 MG tablet Take 1 tablet (50 mg total) by mouth daily with breakfast. 12/03/21  Yes Jacalyn Lefevre, MD  calcium carbonate (TUMS - DOSED IN MG ELEMENTAL CALCIUM) 500 MG chewable tablet Chew 1 tablet by mouth as needed for indigestion or heartburn. HEARTBURN...    [provider]  lisinopril-hydrochlorothiazide (ZESTORETIC) 20-25 MG tablet Take 1 tablet by mouth daily. 04/19/19   [provider]  metoprolol (TOPROL XL) 50 MG 24 hr tablet Take 1 tablet (50 mg total) by mouth daily. 02/06/11 02/06/12  Alinda Money, MD  ondansetron (ZOFRAN ODT) 4 MG disintegrating tablet Take 1 tablet (4 mg total) by mouth every 8 (eight) hours as needed for nausea or vomiting. 06/02/19   Elson Areas, PA-C  simvastatin (ZOCOR) 40 MG tablet Take 40 mg by mouth at bedtime. 04/19/19   [provider]  Thiamine HCl (VITAMIN B-1) 250 MG tablet Take 250 mg by mouth daily.    [provider]  zolpidem (AMBIEN) 5 MG tablet Take 1 tablet (5 mg total) by mouth at bedtime as  needed for sleep (insomnia). 02/06/11 02/06/12  Alinda Money, MD      Allergies    Patient has no known allergies.    Review of Systems   Review of Systems  Musculoskeletal:        Bilateral leg pain  All other systems reviewed and are negative.   Physical Exam Updated Vital Signs BP (!) 118/102 (BP Location: Right Arm)   Pulse 62   Temp 98.2 F (36.8 C) (Oral)   Resp 16   Ht 5\' 11"  (1.803 m)   Wt 94.8 kg   SpO2 99%   BMI 29.15 kg/m  Physical Exam Vitals and nursing note reviewed.  Constitutional:      Appearance: Normal appearance.  HENT:     Head: Normocephalic and atraumatic.     Right Ear: External ear normal.     Left Ear: External ear normal.     Nose: Nose normal.     Mouth/Throat:     Mouth: Mucous membranes are moist.     Pharynx: Oropharynx is clear.  Eyes:     Extraocular Movements: Extraocular movements intact.     Pupils: Pupils are equal, round, and reactive to light.  Cardiovascular:     Rate and Rhythm: Normal rate and regular rhythm.     Pulses: Normal pulses.     Heart  sounds: Normal heart sounds.  Pulmonary:     Effort: Pulmonary effort is normal.     Breath sounds: Normal breath sounds.  Abdominal:     General: Abdomen is flat. Bowel sounds are normal.     Palpations: Abdomen is soft.  Musculoskeletal:        General: Normal range of motion.     Cervical back: Normal range of motion and neck supple.     Right lower leg: Edema present.     Left lower leg: Edema present.  Skin:    General: Skin is warm.     Capillary Refill: Capillary refill takes less than 2 seconds.  Neurological:     General: No focal deficit present.     Mental Status: He is alert and oriented to person, place, and time.  Psychiatric:        Mood and Affect: Mood normal.        Behavior: Behavior normal.     ED Results / Procedures / Treatments   Labs (all labs ordered are listed, but only abnormal results are displayed) Labs Reviewed  CBC WITH  DIFFERENTIAL/PLATELET - Abnormal; Notable for the following components:      Result Value   RBC 3.91 (*)    Hemoglobin 12.7 (*)    HCT 38.3 (*)    All other components within normal limits  BASIC METABOLIC PANEL - Abnormal; Notable for the following components:   Potassium 3.4 (*)    Glucose, Bld 102 (*)    All other components within normal limits  BRAIN NATRIURETIC PEPTIDE    EKG None  Radiology VAS [place] LOWER EXTREMITY VENOUS (DVT) (ONLY [facility] & [facility])  Result Date: 12/03/2021  Lower Venous DVT Study Patient Name:  Lawrence Scott  Date of Exam:   12/03/2021 Medical Rec #: [id number]       Accession #:    [id number] Date of Birth: 15-Jun-1950       Patient Gender: M Patient Age:   52 years Exam Location:  [facility] Procedure:      VAS [place] LOWER EXTREMITY VENOUS (DVT) Referring Phys: Lynden Oxford --------------------------------------------------------------------------------  Indications: Foot pain and swelling.  Comparison Study: No prior study on file Performing Technologist: Sherren Kerns RVS  Examination Guidelines: A complete evaluation includes B-mode imaging, spectral Doppler, color Doppler, and power Doppler as needed of all accessible portions of each vessel. Bilateral testing is considered an integral part of a complete examination. Limited examinations for reoccurring indications may be performed as noted. The reflux portion of the exam is performed with the patient in reverse Trendelenburg.  +---------+---------------+---------+-----------+----------+--------------+ RIGHT    CompressibilityPhasicitySpontaneityPropertiesThrombus Aging +---------+---------------+---------+-----------+----------+--------------+ CFV      Full           Yes      Yes                                 +---------+---------------+---------+-----------+----------+--------------+ SFJ      Full                                                         +---------+---------------+---------+-----------+----------+--------------+ FV Prox  Full                                                        +---------+---------------+---------+-----------+----------+--------------+  FV Mid   Full                                                        +---------+---------------+---------+-----------+----------+--------------+ FV DistalFull                                                        +---------+---------------+---------+-----------+----------+--------------+ PFV      Full                                                        +---------+---------------+---------+-----------+----------+--------------+ POP      Full           Yes      Yes                                 +---------+---------------+---------+-----------+----------+--------------+ PTV      Full                                                        +---------+---------------+---------+-----------+----------+--------------+ PERO     Full                                                        +---------+---------------+---------+-----------+----------+--------------+   +---------+---------------+---------+-----------+----------+--------------+ LEFT     CompressibilityPhasicitySpontaneityPropertiesThrombus Aging +---------+---------------+---------+-----------+----------+--------------+ CFV      Full           Yes      Yes                                 +---------+---------------+---------+-----------+----------+--------------+ SFJ      Full                                                        +---------+---------------+---------+-----------+----------+--------------+ FV Prox  Full                                                        +---------+---------------+---------+-----------+----------+--------------+ FV Mid   Full                                                         +---------+---------------+---------+-----------+----------+--------------+  FV DistalFull                                                        +---------+---------------+---------+-----------+----------+--------------+ PFV      Full                                                        +---------+---------------+---------+-----------+----------+--------------+ POP      Full           Yes      Yes                                 +---------+---------------+---------+-----------+----------+--------------+ PTV      Full                                                        +---------+---------------+---------+-----------+----------+--------------+ PERO     Full                                                        +---------+---------------+---------+-----------+----------+--------------+     Summary: BILATERAL: - No evidence of deep vein thrombosis seen in the lower extremities, bilaterally. -No evidence of popliteal cyst, bilaterally. Some evidence of venous insuffiencey noted, suggest outpatient venous reflux study.   *See table(s) above for measurements and observations.    Preliminary     Procedures Procedures    Medications Ordered in ED Medications  sodium chloride 0.9 % bolus 1,000 mL (has no administration in time range)  ketorolac (TORADOL) 30 MG/ML injection 30 mg (has no administration in time range)  dexamethasone (DECADRON) injection 10 mg (has no administration in time range)    ED Course/ Medical Decision Making/ A&P                           Medical Decision Making Amount and/or Complexity of Data Reviewed Labs: ordered.  Risk Prescription drug management.   This patient presents to the ED for concern of leg pain, this involves an extensive number of treatment options, and is a complaint that carries with it a high risk of complications and morbidity.  The differential diagnosis includes arthritis, dvt, neuropathy, peripheral edema   Co  morbidities that complicate the patient evaluation  high cholesterol and htn   Additional history obtained:  Additional history obtained from epic chart review    Lab Tests:  I Ordered, and personally interpreted labs.  The pertinent results include:  bnp 21.4, bmp nl, cbc 12.7   Imaging Studies ordered:  I ordered imaging studies including [place]  I independently visualized and interpreted imaging which showed  BILATERAL:  - No evidence of deep vein thrombosis seen in the lower extremities, bilaterally.  -No evidence of popliteal cyst, bilaterally. Some  evidence of venous insuffiencey noted, suggest outpatient venous reflux study.      I agree with the radiologist interpretation   Cardiac Monitoring:  The patient was maintained on a cardiac monitor.  I personally viewed and interpreted the cardiac monitored which showed an underlying rhythm of: nsr   Medicines ordered and prescription drug management:  I ordered medication including toradol and decadron  for pain  Reevaluation of the patient after these medicines showed that the patient improved I have reviewed the patients home medicines and have made adjustments as needed  Problem List / ED Course:  Leg pain:  no DVT.  Pt has good pulses.  He may have an arthritis flare, so I will put him on prednisone.  Pt also may have neuropathy, so I will put him on gabapentin.  Pt is stable for d/c.  Return if worse.  F/u with pcp.   Reevaluation:  After the interventions noted above, I reevaluated the patient and found that they have :improved   Social Determinants of Health:  Lives at home   Dispostion:  After consideration of the diagnostic results and the patients response to treatment, I feel that the patent would benefit from discharge with outpatient f/u.          Final Clinical Impression(s) / ED Diagnoses Final diagnoses:  Bilateral leg pain    Rx / DC Orders ED Discharge Orders          Ordered     predniSONE (DELTASONE) 50 MG tablet  Daily with breakfast        12/03/21 1815    gabapentin (NEURONTIN) 300 MG capsule  Daily at bedtime        12/03/21 1815              Jacalyn Lefevre, MD 12/03/21 1824

## 2021-12-03 NOTE — Progress Notes (Signed)
VASCULAR LAB    Bilateral lower extremity venous duplex has been performed.  See CV proc for preliminary results.   Lakayla Barrington, RVT 12/03/2021, 6:00 PM

## 2021-12-06 DIAGNOSIS — Z23 Encounter for immunization: Secondary | ICD-10-CM | POA: Diagnosis not present

## 2021-12-06 DIAGNOSIS — M79672 Pain in left foot: Secondary | ICD-10-CM | POA: Diagnosis not present

## 2021-12-06 DIAGNOSIS — M7989 Other specified soft tissue disorders: Secondary | ICD-10-CM | POA: Diagnosis not present

## 2021-12-06 DIAGNOSIS — M79671 Pain in right foot: Secondary | ICD-10-CM | POA: Diagnosis not present

## 2021-12-09 DIAGNOSIS — R351 Nocturia: Secondary | ICD-10-CM | POA: Diagnosis not present

## 2021-12-09 DIAGNOSIS — N401 Enlarged prostate with lower urinary tract symptoms: Secondary | ICD-10-CM | POA: Diagnosis not present

## 2021-12-09 DIAGNOSIS — R972 Elevated prostate specific antigen [PSA]: Secondary | ICD-10-CM | POA: Diagnosis not present

## 2021-12-12 ENCOUNTER — Other Ambulatory Visit: Payer: Self-pay | Admitting: Urology

## 2021-12-12 DIAGNOSIS — R972 Elevated prostate specific antigen [PSA]: Secondary | ICD-10-CM

## 2021-12-13 DIAGNOSIS — M79605 Pain in left leg: Secondary | ICD-10-CM | POA: Diagnosis not present

## 2021-12-13 DIAGNOSIS — M79604 Pain in right leg: Secondary | ICD-10-CM | POA: Diagnosis not present

## 2021-12-15 ENCOUNTER — Ambulatory Visit (HOSPITAL_COMMUNITY)
Admission: RE | Admit: 2021-12-15 | Discharge: 2021-12-15 | Disposition: A | Discharge status: Home / Self Care | Payer: Medicare HMO | Source: Ambulatory Visit | Attending: Vascular Surgery | Admitting: Vascular Surgery

## 2021-12-15 ENCOUNTER — Other Ambulatory Visit (HOSPITAL_COMMUNITY): Payer: Self-pay | Admitting: Family Medicine

## 2021-12-15 DIAGNOSIS — R609 Edema, unspecified: Secondary | ICD-10-CM

## 2021-12-23 DIAGNOSIS — R972 Elevated prostate specific antigen [PSA]: Secondary | ICD-10-CM | POA: Diagnosis not present

## 2021-12-23 DIAGNOSIS — G609 Hereditary and idiopathic neuropathy, unspecified: Secondary | ICD-10-CM | POA: Diagnosis not present

## 2021-12-25 ENCOUNTER — Ambulatory Visit
Admission: RE | Admit: 2021-12-25 | Discharge: 2021-12-25 | Disposition: A | Discharge status: Home / Self Care | Payer: Medicare HMO | Source: Ambulatory Visit | Attending: Urology | Admitting: Urology

## 2021-12-25 DIAGNOSIS — R972 Elevated prostate specific antigen [PSA]: Secondary | ICD-10-CM

## 2021-12-25 DIAGNOSIS — N401 Enlarged prostate with lower urinary tract symptoms: Secondary | ICD-10-CM | POA: Diagnosis not present

## 2021-12-25 DIAGNOSIS — R590 Localized enlarged lymph nodes: Secondary | ICD-10-CM | POA: Diagnosis not present

## 2021-12-25 MED ORDER — GADOPICLENOL 0.5 MMOL/ML IV SOLN
10.0000 mL | Freq: Once | INTRAVENOUS | Status: AC | PRN
  Administered 2021-12-25: 10 mL via INTRAVENOUS

## 2021-12-28 ENCOUNTER — Encounter: Payer: Self-pay | Admitting: Vascular Surgery

## 2021-12-28 ENCOUNTER — Ambulatory Visit: Payer: Medicare HMO | Admitting: Vascular Surgery

## 2021-12-28 VITALS — BP 126/79 | HR 80 | Temp 98.6°F | Resp 18 | Ht 70.0 in | Wt 215.6 lb

## 2021-12-28 DIAGNOSIS — I872 Venous insufficiency (chronic) (peripheral): Secondary | ICD-10-CM

## 2021-12-28 NOTE — Progress Notes (Signed)
ASSESSMENT & PLAN   CHRONIC VENOUS INSUFFICIENCY: This patient has CEAP C2 venous disease (varicose veins).  We have discussed the importance of intermittent leg elevation and the proper positioning for this.  I have encouraged him to continue to wear his knee-high compression stockings with a gradient of 15 to 20 mmHg.  I have encouraged him to avoid prolonged sitting and standing.  We have discussed the importance of exercise specifically walking and water aerobics.  We have also discussed the importance of nutrition.  He understands that central obesity increases lower extremity venous pressure.  His swelling is more significant on the right and he does have deep and superficial venous reflux on the right.  I think he would be a candidate for laser ablation of the right great saphenous vein however his main complaint is pain in his feet and I do not think this would significantly impact his pain in his feet.  However if his symptoms progress or his varicose veins enlarge significantly I think we can reevaluate him.  We could try him in a thigh-high compression stocking with a gradient of 20 to 30 mmHg.  If that was not helpful then he I think he would be a candidate for laser ablation of the right great saphenous vein down to the knee.   REASON FOR CONSULT:    Swelling of bilateral lower extremities.  The consult is requested by Dr. Aura Dials.  HPI:   Lawrence Scott is a 71 y.o. male who presents for evaluation of bilateral lower extremity swelling.  I have reviewed the records from the referring office.  The patient was seen on 12/13/2021.  He was having pain in his legs.  He was also having leg swelling.  This prompted a duplex scan that was done on 12/15/2021.  He did not have any evidence of DVT on either side.  The results of this test are noted below.  He does have pain in his feet secondary to osteoarthritis.  He may also have some neuropathy.    On my history, the swelling is more  significant on the right side and is predominantly in the foot.  This is been going on for 2 months.  Patient's chief complaint is pain in his feet related to his arthritis in his feet.  I do not any history of claudication, rest pain, or nonhealing ulcers.  Past Medical History:  Diagnosis Date   High cholesterol    Hypertension     Family History  Problem Relation Age of Onset   Lung cancer Other     SOCIAL HISTORY: Social History   Tobacco Use   Smoking status: Never   Smokeless tobacco: Not on file  Substance Use Topics   Alcohol use: Yes    Alcohol/week: 1.0 standard drink of alcohol    Types: 1 drink(s) per week    Comment: one mixed drink before bed     No Known Allergies  Current Outpatient Medications  Medication Sig Dispense Refill   calcium carbonate (TUMS - DOSED IN MG ELEMENTAL CALCIUM) 500 MG chewable tablet Chew 1 tablet by mouth as needed for indigestion or heartburn. HEARTBURN...     folic acid (FOLVITE) [id number] MCG tablet Take 800 mcg by mouth daily.     gabapentin (NEURONTIN) 300 MG capsule Take 1 capsule (300 mg total) by mouth at bedtime. 30 capsule 0   lisinopril-hydrochlorothiazide (ZESTORETIC) 20-25 MG tablet Take 1 tablet by mouth daily.     oxybutynin (DITROPAN)  5 MG tablet Take 5 mg by mouth daily.     simvastatin (ZOCOR) 40 MG tablet Take 40 mg by mouth at bedtime.     Thiamine HCl (VITAMIN B-1) 250 MG tablet Take 250 mg by mouth daily.     metoprolol (TOPROL XL) 50 MG 24 hr tablet Take 1 tablet (50 mg total) by mouth daily. 30 tablet 0   ondansetron (ZOFRAN ODT) 4 MG disintegrating tablet Take 1 tablet (4 mg total) by mouth every 8 (eight) hours as needed for nausea or vomiting. (Patient not taking: Reported on 12/28/2021) 20 tablet 0   predniSONE (DELTASONE) 50 MG tablet Take 1 tablet (50 mg total) by mouth daily with breakfast. (Patient not taking: Reported on 12/28/2021) 5 tablet 0   zolpidem (AMBIEN) 5 MG tablet Take 1 tablet (5 mg total) by mouth  at bedtime as needed for sleep (insomnia). 30 tablet 0   No current facility-administered medications for this visit.    REVIEW OF SYSTEMS:  [X]  denotes positive finding, [ ]  denotes negative finding Cardiac  Comments:  Chest pain or chest pressure:    Shortness of breath upon exertion:    Short of breath when lying flat:    Irregular heart rhythm:        Vascular    Pain in calf, thigh, or hip brought on by ambulation:    Pain in feet at night that wakes you up from your sleep:  x   Blood clot in your veins:    Leg swelling:         Pulmonary    Oxygen at home:    Productive cough:     Wheezing:         Neurologic    Sudden weakness in arms or legs:     Sudden numbness in arms or legs:     Sudden onset of difficulty speaking or slurred speech:    Temporary loss of vision in one eye:     Problems with dizziness:         Gastrointestinal    Blood in stool:     Vomited blood:         Genitourinary    Burning when urinating:     Blood in urine:        Psychiatric    Major depression:         Hematologic    Bleeding problems:    Problems with blood clotting too easily:        Skin    Rashes or ulcers:        Constitutional    Fever or chills:    -  PHYSICAL EXAM:   Vitals:   12/28/21 0935  BP: 126/79  Pulse: 80  Resp: 18  Temp: 98.6 F (37 C)  TempSrc: Temporal  SpO2: 98%  Weight: 215 lb 9.6 oz (97.8 kg)  Height: 5\' 10"  (1.778 m)  HC: 2" (5.1 cm)   Body mass index is 30.94 kg/m. GENERAL: The patient is a well-nourished male, in no acute distress. The vital signs are documented above. CARDIAC: There is a regular rate and rhythm.  VASCULAR: I do not detect carotid bruits. He has palpable pedal pulses. He has varicose veins along the medial aspect of the right leg. I looked at his right great saphenous vein myself with the SonoSite.  It is markedly dilated down to the knee with diameters ranging from 5 to 7 mm.  He has reflux throughout. PULMONARY:  There  is good air exchange bilaterally without wheezing or rales. ABDOMEN: Soft and non-tender with normal pitched bowel sounds.  MUSCULOSKELETAL: There are no major deformities. NEUROLOGIC: No focal weakness or paresthesias are detected. SKIN: There are no ulcers or rashes noted. PSYCHIATRIC: The patient has a normal affect.  DATA:    VENOUS DUPLEX: I have independently interpreted his venous duplex scan that was done on 12/15/2021.  On the right side, there was no evidence of DVT.  There was deep venous reflux in the common femoral vein.  There was superficial venous reflux in the right great saphenous vein from the saphenofemoral junction to the proximal calf.  Diameters of the vein ranged from 5-7 mm.    On the left side, there is no evidence of DVT.  There is deep venous reflux in the common femoral vein and femoral vein.  There is some superficial venous reflux in the proximal thigh on the left.  The vein is not dilated (3 mm).     Deitra Mayo [facility]

## 2022-02-14 DIAGNOSIS — R972 Elevated prostate specific antigen [PSA]: Secondary | ICD-10-CM | POA: Diagnosis not present

## 2022-02-14 DIAGNOSIS — M79671 Pain in right foot: Secondary | ICD-10-CM | POA: Diagnosis not present

## 2022-02-14 DIAGNOSIS — M79672 Pain in left foot: Secondary | ICD-10-CM | POA: Diagnosis not present

## 2022-02-21 DIAGNOSIS — J0110 Acute frontal sinusitis, unspecified: Secondary | ICD-10-CM | POA: Diagnosis not present

## 2022-02-28 DIAGNOSIS — G629 Polyneuropathy, unspecified: Secondary | ICD-10-CM | POA: Diagnosis not present

## 2022-03-10 DIAGNOSIS — M79672 Pain in left foot: Secondary | ICD-10-CM | POA: Diagnosis not present

## 2022-03-10 DIAGNOSIS — E538 Deficiency of other specified B group vitamins: Secondary | ICD-10-CM | POA: Diagnosis not present

## 2022-03-10 DIAGNOSIS — R972 Elevated prostate specific antigen [PSA]: Secondary | ICD-10-CM | POA: Diagnosis not present

## 2022-03-10 DIAGNOSIS — M79671 Pain in right foot: Secondary | ICD-10-CM | POA: Diagnosis not present

## 2022-03-10 DIAGNOSIS — E790 Hyperuricemia without signs of inflammatory arthritis and tophaceous disease: Secondary | ICD-10-CM | POA: Diagnosis not present

## 2022-03-10 DIAGNOSIS — G609 Hereditary and idiopathic neuropathy, unspecified: Secondary | ICD-10-CM | POA: Diagnosis not present

## 2022-03-10 DIAGNOSIS — J069 Acute upper respiratory infection, unspecified: Secondary | ICD-10-CM | POA: Diagnosis not present

## 2022-05-29 DIAGNOSIS — R051 Acute cough: Secondary | ICD-10-CM | POA: Diagnosis not present

## 2022-06-02 DIAGNOSIS — J208 Acute bronchitis due to other specified organisms: Secondary | ICD-10-CM | POA: Diagnosis not present

## 2022-06-02 DIAGNOSIS — U071 COVID-19: Secondary | ICD-10-CM | POA: Diagnosis not present

## 2022-06-02 DIAGNOSIS — R058 Other specified cough: Secondary | ICD-10-CM | POA: Diagnosis not present

## 2022-06-26 DIAGNOSIS — C61 Malignant neoplasm of prostate: Secondary | ICD-10-CM

## 2022-06-26 HISTORY — DX: Malignant neoplasm of prostate: C61

## 2022-07-04 DIAGNOSIS — G629 Polyneuropathy, unspecified: Secondary | ICD-10-CM | POA: Diagnosis not present

## 2022-07-04 DIAGNOSIS — I872 Venous insufficiency (chronic) (peripheral): Secondary | ICD-10-CM | POA: Diagnosis not present

## 2022-07-04 DIAGNOSIS — L821 Other seborrheic keratosis: Secondary | ICD-10-CM | POA: Diagnosis not present

## 2022-07-04 DIAGNOSIS — I10 Essential (primary) hypertension: Secondary | ICD-10-CM | POA: Diagnosis not present

## 2022-07-04 DIAGNOSIS — M109 Gout, unspecified: Secondary | ICD-10-CM | POA: Diagnosis not present

## 2022-07-04 DIAGNOSIS — J309 Allergic rhinitis, unspecified: Secondary | ICD-10-CM | POA: Diagnosis not present

## 2022-07-04 DIAGNOSIS — E785 Hyperlipidemia, unspecified: Secondary | ICD-10-CM | POA: Diagnosis not present

## 2022-07-04 DIAGNOSIS — R972 Elevated prostate specific antigen [PSA]: Secondary | ICD-10-CM | POA: Diagnosis not present

## 2022-07-06 ENCOUNTER — Other Ambulatory Visit: Payer: Self-pay | Admitting: Family Medicine

## 2022-07-06 ENCOUNTER — Ambulatory Visit
Admission: RE | Admit: 2022-07-06 | Discharge: 2022-07-06 | Disposition: A | Discharge status: Home / Self Care | Payer: Medicare HMO | Source: Ambulatory Visit | Attending: Family Medicine | Admitting: Family Medicine

## 2022-07-06 DIAGNOSIS — I700 Atherosclerosis of aorta: Secondary | ICD-10-CM | POA: Diagnosis not present

## 2022-07-06 DIAGNOSIS — R9389 Abnormal findings on diagnostic imaging of other specified body structures: Secondary | ICD-10-CM

## 2022-07-06 DIAGNOSIS — R918 Other nonspecific abnormal finding of lung field: Secondary | ICD-10-CM | POA: Diagnosis not present

## 2022-07-12 ENCOUNTER — Other Ambulatory Visit: Payer: Self-pay | Admitting: Family Medicine

## 2022-07-12 DIAGNOSIS — Z136 Encounter for screening for cardiovascular disorders: Secondary | ICD-10-CM

## 2022-07-21 DIAGNOSIS — R972 Elevated prostate specific antigen [PSA]: Secondary | ICD-10-CM | POA: Diagnosis not present

## 2022-07-26 DIAGNOSIS — I2510 Atherosclerotic heart disease of native coronary artery without angina pectoris: Secondary | ICD-10-CM

## 2022-07-26 HISTORY — DX: Atherosclerotic heart disease of native coronary artery without angina pectoris: I25.10

## 2022-08-04 DIAGNOSIS — R310 Gross hematuria: Secondary | ICD-10-CM | POA: Diagnosis not present

## 2022-08-04 DIAGNOSIS — C61 Malignant neoplasm of prostate: Secondary | ICD-10-CM | POA: Diagnosis not present

## 2022-08-09 NOTE — Progress Notes (Signed)
GU Location of Tumor / Histology: Prostate Ca  If Prostate Cancer, Gleason Score is (4 + 3) and PSA is (6.82 on 07/21/2022)  Biopsies     12/25/2021 Dr. Rhoderick Moody MR Prostate with/without Contrast CLINICAL DATA: 72 year old male elevated PSA equal 5.7   IMPRESSION: 1. Lesion in the posterior RIGHT mid gland peripheral zone is suspicious for high-grade prostate adenocarcinoma. PI-RADS: 4 (Dynacad 3D post processing performed). ROI #1. 2. Less well-defined LEFT mid gland is also suspicious for high-grade prostate carcinoma. PI-RADS: 4. (Dynacad 3D post processing performed). ROI #2. 3. Minimally enlarged nodular transitional zone most consistent with benign prostate hypertrophy. PI-RADS: 2  Past/Anticipated interventions by urology, if any:     Past/Anticipated interventions by medical oncology, if any: NA  Weight changes, if any:  No  IPSS:  10 SHIM:  20  Bowel/Bladder complaints, if any:  No  Nausea/Vomiting, if any:  No  Pain issues, if any:  0/10  SAFETY ISSUES: Prior radiation?  No Pacemaker/ICD? No Possible current pregnancy? Male Is the patient on methotrexate? No  Current Complaints / other details: Has hearing aids not wearing them today.

## 2022-08-11 ENCOUNTER — Other Ambulatory Visit (HOSPITAL_COMMUNITY): Payer: Self-pay | Admitting: Urology

## 2022-08-11 ENCOUNTER — Ambulatory Visit
Admission: RE | Admit: 2022-08-11 | Discharge: 2022-08-11 | Disposition: A | Discharge status: Home / Self Care | Payer: No Typology Code available for payment source | Source: Ambulatory Visit | Attending: Family Medicine | Admitting: Family Medicine

## 2022-08-11 DIAGNOSIS — Z136 Encounter for screening for cardiovascular disorders: Secondary | ICD-10-CM

## 2022-08-11 DIAGNOSIS — C61 Malignant neoplasm of prostate: Secondary | ICD-10-CM

## 2022-08-14 ENCOUNTER — Ambulatory Visit
Admission: RE | Admit: 2022-08-14 | Discharge: 2022-08-14 | Disposition: A | Discharge status: Home / Self Care | Payer: Medicare HMO | Source: Ambulatory Visit | Attending: Radiation Oncology | Admitting: Radiation Oncology

## 2022-08-14 ENCOUNTER — Encounter: Payer: Self-pay | Admitting: Radiation Oncology

## 2022-08-14 VITALS — BP 133/75 | HR 79 | Temp 96.8°F | Resp 18 | Ht 70.0 in | Wt 211.2 lb

## 2022-08-14 DIAGNOSIS — I700 Atherosclerosis of aorta: Secondary | ICD-10-CM | POA: Diagnosis not present

## 2022-08-14 DIAGNOSIS — E7800 Pure hypercholesterolemia, unspecified: Secondary | ICD-10-CM | POA: Diagnosis not present

## 2022-08-14 DIAGNOSIS — Z191 Hormone sensitive malignancy status: Secondary | ICD-10-CM | POA: Diagnosis not present

## 2022-08-14 DIAGNOSIS — E785 Hyperlipidemia, unspecified: Secondary | ICD-10-CM | POA: Diagnosis not present

## 2022-08-14 DIAGNOSIS — I10 Essential (primary) hypertension: Secondary | ICD-10-CM | POA: Insufficient documentation

## 2022-08-14 DIAGNOSIS — Z801 Family history of malignant neoplasm of trachea, bronchus and lung: Secondary | ICD-10-CM | POA: Insufficient documentation

## 2022-08-14 DIAGNOSIS — C61 Malignant neoplasm of prostate: Secondary | ICD-10-CM | POA: Insufficient documentation

## 2022-08-14 DIAGNOSIS — Z79899 Other long term (current) drug therapy: Secondary | ICD-10-CM | POA: Diagnosis not present

## 2022-08-14 HISTORY — DX: Hyperlipidemia, unspecified: E78.5

## 2022-08-14 NOTE — Progress Notes (Addendum)
Introduced myself to the patient as the prostate nurse navigator. He is here to discuss his radiation treatment options.  Patient does have upcoming imaging scheduled on 6/6, and believes he has surgical consult with Dr. Laverle Patter.  I will follow up to ensure he is scheduled, and confirm date with patient.  Patient also voiced concerns with finances, I provided education on resources we have available and once he has finalized his treatment decision we will connect him appropriately.  I gave him my business card and asked him to call me with questions or concerns.  Verbalized understanding.

## 2022-08-14 NOTE — Progress Notes (Signed)
Radiation Oncology         [phone] ________________________________  Initial Outpatient Consultation  Name: Lawrence Scott MRN: [id number]  Date: 08/14/2022  DOB: Mar 14, 1951  CC:[name], MD  Shelly Rubenstein*   REFERRING PHYSICIAN: Shelly Rubenstein*  DIAGNOSIS: 72 y.o. gentleman with Stage T1c adenocarcinoma of the prostate with Gleason score of 4+3, and PSA of 6.82.    ICD-10-CM   1. Malignant neoplasm of prostate (HCC)  C61       HISTORY OF PRESENT ILLNESS: Lawrence Scott is a 72 y.o. male with a diagnosis of prostate cancer. He was noted to have an elevated PSA of 5.72 by his primary care physician, Dr. Abigail Miyamoto.  Accordingly, he was referred for evaluation in urology by Dr. Liliane Shi on 12/09/21,  digital rectal examination was performed at that time revealing no concerning lesions. The patient underwent MRI of the prostate on 12/26/22 which revealed a PI-RADS 4 lesion in the posterior right mid gland peripheral zone and in the left mid gland.  The patient proceeded to transrectal ultrasound with 19 biopsies of the prostate on 07/21/22.  The prostate volume measured 29 cc.  Out of 19 core biopsies, 7 were positive.  The maximum Gleason score was 4+3 in ROI #1 and the right base. Additionally Gleason score of 3+4 was seen in the right mid lateral; and a Gleason score of 3+3 this was seen in the right mid and right base lateral.  Staging CT and bone scan are scheduled for 08/31/22. Patient is scheduled to meet Dr. Laverle Patter on 09/05/22.   The patient reviewed the biopsy results with his urologist and he has kindly been referred today for discussion of potential radiation treatment options.   PREVIOUS RADIATION THERAPY: No  PAST MEDICAL HISTORY:  Past Medical History:  Diagnosis Date   High cholesterol    Hyperlipidemia    Hypertension       PAST SURGICAL HISTORY: Past Surgical History:  Procedure Laterality Date   PROSTATE BIOPSY      FAMILY HISTORY:   Family History  Problem Relation Age of Onset   Lung cancer Other     SOCIAL HISTORY:  Social History   Socioeconomic History   Marital status: Single    Spouse name: Not on file   Number of children: Not on file   Years of education: Not on file   Highest education level: Not on file  Occupational History   Not on file  Tobacco Use   Smoking status: Never   Smokeless tobacco: Not on file  Vaping Use   Vaping Use: Never used  Substance and Sexual Activity   Alcohol use: Yes    Alcohol/week: 1.0 standard drink of alcohol    Types: 1 drink(s) per week    Comment: one mixed drink before bed    Drug use: No   Sexual activity: Not Currently  Other Topics Concern   Not on file  Social History Narrative   Not on file   Social Determinants of Health   Financial Resource Strain: Not on file  Food Insecurity: No Food Insecurity (08/14/2022)   Hunger Vital Sign    Worried About Running Out of Food in the Last Year: Never true    Ran Out of Food in the Last Year: Never true  Transportation Needs: No Transportation Needs (08/14/2022)   PRAPARE - Administrator, Civil Service (Medical): No    Lack of Transportation (Non-Medical): No  Physical Activity: Not  on file  Stress: Not on file  Social Connections: Not on file  Intimate Partner Violence: Not At Risk (08/14/2022)   Humiliation, Afraid, Rape, and Kick questionnaire    Fear of Current or Ex-Partner: No    Emotionally Abused: No    Physically Abused: No    Sexually Abused: No    ALLERGIES: Patient has no known allergies.  MEDICATIONS:  Current Outpatient Medications  Medication Sig Dispense Refill   cyanocobalamin (VITAMIN B12) 1000 MCG/ML injection INJECT SUBCUTANEOUSLY ONCE WEEKLY FOR 4 WEEKS,THEN ONCE MONTHLY for 90     folic acid (FOLVITE) 800 MCG tablet Take 800 mcg by mouth daily.     Influenza Vac Typ A&B Surf Ant (FLUVIRIN IM)      lisinopril-hydrochlorothiazide (ZESTORETIC) 20-25 MG tablet  Take 1 tablet by mouth daily.     multivitamin (ONE-A-DAY MEN'S) TABS tablet 1 tablet Orally Once a day     simvastatin (ZOCOR) 40 MG tablet Take 40 mg by mouth at bedtime.     No current facility-administered medications for this encounter.    REVIEW OF SYSTEMS:  On review of systems, the patient reports that he is doing well overall. He denies any chest pain, shortness of breath, cough, fevers, chills, night sweats, unintended weight changes. He denies any bowel disturbances, and denies abdominal pain, nausea or vomiting. He denies any new musculoskeletal or joint aches or pains. His IPSS was 10, indicating moderate urinary symptoms.  His SHIM was 20, indicating he has mild erectile dysfunction. A complete review of systems is obtained and is otherwise negative.   PHYSICAL EXAM:  Wt Readings from Last 3 Encounters:  08/14/22 211 lb 4 oz (95.8 kg)  12/28/21 215 lb 9.6 oz (97.8 kg)  12/03/21 209 lb (94.8 kg)   Temp Readings from Last 3 Encounters:  08/14/22 (!) 96.8 F (36 C) (Temporal)  12/28/21 98.6 F (37 C) (Temporal)  12/03/21 98.2 F (36.8 C) (Oral)   BP Readings from Last 3 Encounters:  08/14/22 133/75  12/28/21 126/79  12/03/21 (!) 118/102   Pulse Readings from Last 3 Encounters:  08/14/22 79  12/28/21 80  12/03/21 62   Pain Assessment Pain Score: 0-No pain/10  In general this is a well appearing gentleman in no acute distress. He's alert and oriented x4 and appropriate throughout the examination. Cardiopulmonary assessment is negative for acute distress, and he exhibits normal effort.    KPS = 100  100 - Normal; no complaints; no evidence of disease. 90   - Able to carry on normal activity; minor signs or symptoms of disease. 80   - Normal activity with effort; some signs or symptoms of disease. 22   - Cares for self; unable to carry on normal activity or to do active work. 60   - Requires occasional assistance, but is able to care for most of his personal  needs. 50   - Requires considerable assistance and frequent medical care. 40   - Disabled; requires special care and assistance. 30   - Severely disabled; hospital admission is indicated although death not imminent. 20   - Very sick; hospital admission necessary; active supportive treatment necessary. 10   - Moribund; fatal processes progressing rapidly. 0     - Dead  Karnofsky DA, Abelmann WH, Craver LS and Burchenal [facility] [phone]) The use of the nitrogen mustards in the palliative treatment of carcinoma: with particular reference to bronchogenic carcinoma Cancer 1 634-56  LABORATORY DATA:  Lab Results  Component Value  Date   WBC 7.2 12/03/2021   HGB 12.7 (L) 12/03/2021   HCT 38.3 (L) 12/03/2021   MCV 98.0 12/03/2021   PLT 262 12/03/2021   Lab Results  Component Value Date   NA 141 12/03/2021   K 3.4 (L) 12/03/2021   CL 109 12/03/2021   CO2 25 12/03/2021   Lab Results  Component Value Date   ALT 22 06/02/2019   AST 23 06/02/2019   ALKPHOS 53 06/02/2019   BILITOT 0.6 06/02/2019     RADIOGRAPHY: CT CARDIAC SCORING ([facility] LOCATIONS ONLY)  Result Date: 08/12/2022 CLINICAL DATA:  72 year old Caucasian male with history of hypertension. * Tracking Code: FCC * EXAM: CT CARDIAC CORONARY ARTERY CALCIUM SCORE TECHNIQUE: Non-contrast imaging through the heart was performed using prospective ECG gating. Image post processing was performed on an independent workstation, allowing for quantitative analysis of the heart and coronary arteries. Note that this exam targets the heart and the chest was not imaged in its entirety. COMPARISON:  Chest CTA 06/02/2019. FINDINGS: CORONARY CALCIUM SCORES: Left Main: 3 LAD: 550 LCx: 104 RCA: 1,739 Total Agatston Score: 2,396 MESA database percentile: 93rd AORTA MEASUREMENTS: Ascending Aorta: 4.1 cm Descending Aorta:2.7 cm OTHER FINDINGS: Atherosclerotic calcifications in the thoracic aorta. Within the visualized portions of the thorax there are no suspicious  appearing pulmonary nodules or masses, there is no acute consolidative airspace disease, no pleural effusions, no pneumothorax and no lymphadenopathy. Visualized portions of the upper abdomen are unremarkable. There are no aggressive appearing lytic or blastic lesions noted in the visualized portions of the skeleton. IMPRESSION: 1. Patient's total coronary artery calcium score is 2,396 which is 93rd percentile for patient's of matched age, gender and race/ethnicity. Please note that although the presence of coronary artery calcium documents the presence of coronary artery disease, the severity of this disease and any potential stenosis cannot be assessed on this noncontrast CT examination. Assessment for potential risk factor modification, dietary therapy or pharmacologic therapy may be warranted, if clinically indicated. 2. 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 Recommend annual imaging followup by CTA or MRA. This recommendation follows 2010 ACCF/AHA/AATS/ACR/ASA/SCA/SCAI/SIR/STS/SVM Guidelines for the Diagnosis and Management of Patients with Thoracic Aortic Disease. Circulation. 2010; 121: N027-O536. Aortic aneurysm NOS (ICD10-I71.9). Electronically Signed   By: Trudie Reed M.D.   On: 08/12/2022 06:32      IMPRESSION/PLAN: 1. 72 y.o. gentleman with Stage T1c adenocarcinoma of the prostate with Gleason score of 4+3, and PSA of 6.82. We discussed the patient's workup and outlined the nature of prostate cancer in this setting. The patient's T stage, Gleason's score, and PSA put him into the unfavorable, intermediate risk group. Accordingly, he is eligible for a variety of potential treatment options including  brachytherapy, 5.5 weeks of external radiation, or prostatectomy. We discussed the available radiation techniques, and focused on the details and logistics of delivery. We discussed and outlined the risks, benefits, short and long-term effects associated  with radiotherapy and compared and contrasted these with prostatectomy. We discussed the role of SpaceOAR gel in reducing the rectal toxicity associated with radiotherapy.  He appears to have a good understanding of his disease and our treatment recommendations which are of curative intent.  He was encouraged to ask questions that were answered to his stated satisfaction.  Patient is scheduled to complete his staging work up with CT and bone scan on 08/31/22. He is scheduled to meet with Dr. Laverle Patter for surgical consultation on 09/05/22. At the conclusion of  our conversation, the patient is currently most interested in moving forward with external beam radiotherapy. He is a good candidate for this treatment option. We encouraged him to make his final decision after his consultation with Dr. Laverle Patter. Patient knows to contact our nurse navigator, Marisue Ivan, after making his decision.   We personally spent 60 minutes in this encounter including chart review, reviewing radiological studies, meeting face-to-face with the patient, entering orders and completing documentation.    Joyice Faster, PA-C    Margaretmary Dys, MD  [facility]  Radiation Oncology Direct Dial: [phone]  Fax: [phone] [email or web address]  Skype  LinkedIn

## 2022-08-15 DIAGNOSIS — L814 Other melanin hyperpigmentation: Secondary | ICD-10-CM | POA: Diagnosis not present

## 2022-08-15 DIAGNOSIS — L298 Other pruritus: Secondary | ICD-10-CM | POA: Diagnosis not present

## 2022-08-15 DIAGNOSIS — R208 Other disturbances of skin sensation: Secondary | ICD-10-CM | POA: Diagnosis not present

## 2022-08-15 DIAGNOSIS — L570 Actinic keratosis: Secondary | ICD-10-CM | POA: Diagnosis not present

## 2022-08-15 DIAGNOSIS — L821 Other seborrheic keratosis: Secondary | ICD-10-CM | POA: Diagnosis not present

## 2022-08-15 DIAGNOSIS — Z789 Other specified health status: Secondary | ICD-10-CM | POA: Diagnosis not present

## 2022-08-15 DIAGNOSIS — D225 Melanocytic nevi of trunk: Secondary | ICD-10-CM | POA: Diagnosis not present

## 2022-08-15 DIAGNOSIS — L538 Other specified erythematous conditions: Secondary | ICD-10-CM | POA: Diagnosis not present

## 2022-08-15 DIAGNOSIS — L820 Inflamed seborrheic keratosis: Secondary | ICD-10-CM | POA: Diagnosis not present

## 2022-08-17 NOTE — Progress Notes (Signed)
Cardiology Office Note:    Date:  08/18/2022   ID:  Lawrence, Scott December 09, 1950, MRN [id number]  PCP:  [name], MD   [facility] Providers Cardiologist:  None     Referring MD: [name], MD   Chief Complaint  Patient presents with   Coronary Artery Disease    History of Present Illness:    Lawrence Scott is a 72 y.o. male is seen at the request of Dr Abigail Miyamoto for evaluation of atherosclerosis/CAD. He has a history of HTN and HLD. He has chronic venous insufficiency followed by Dr Edilia Bo. Recent screening coronary calcium score is abnormal with score of 2396. He also had prior CT of the chest in 2012 and 2021 showing significant coronary calcification.   He is now retired. He does note that every now and then he gets pain in his chest. Not clearly exertional but does note he is more SOB than before. He has recently been diagnosed with prostate CA- seeing Dr Laverle Patter.   Past Medical History:  Diagnosis Date   Gout    High cholesterol    Hyperlipidemia    Hypertension    Prostate cancer (HCC)     Past Surgical History:  Procedure Laterality Date   PROSTATE BIOPSY      Current Medications: Current Meds  Medication Sig   allopurinol (ZYLOPRIM) 100 MG tablet Take 100 mg by mouth daily.   aspirin EC 81 MG tablet Take 81 mg by mouth daily. Swallow whole.   cyanocobalamin (VITAMIN B12) 1000 MCG/ML injection 1,000 mcg every 30 (thirty) days.   folic acid (FOLVITE) 800 MCG tablet Take 800 mcg by mouth daily.   Influenza Vac Typ A&B Surf Ant (FLUVIRIN IM)    lisinopril-hydrochlorothiazide (ZESTORETIC) 20-25 MG tablet Take 1 tablet by mouth daily.   multivitamin (ONE-A-DAY MEN'S) TABS tablet 1 tablet Orally Once a day   rosuvastatin (CRESTOR) 40 MG tablet Take 1 tablet (40 mg total) by mouth daily.   [DISCONTINUED] simvastatin (ZOCOR) 40 MG tablet Take 40 mg by mouth at bedtime.     Allergies:   Patient has no known allergies.   Social History    Socioeconomic History   Marital status: Single    Spouse name: Not on file   Number of children: 2   Years of education: Not on file   Highest education level: Not on file  Occupational History   Not on file  Tobacco Use   Smoking status: Never   Smokeless tobacco: Not on file  Vaping Use   Vaping Use: Never used  Substance and Sexual Activity   Alcohol use: Yes    Alcohol/week: 1.0 standard drink of alcohol    Types: 1 drink(s) per week    Comment: one mixed drink before bed    Drug use: No   Sexual activity: Not Currently  Other Topics Concern   Not on file  Social History Narrative   Retired Games developer   Social Determinants of Health   Financial Resource Strain: Not on file  Food Insecurity: No Food Insecurity (08/14/2022)   Hunger Vital Sign    Worried About Running Out of Food in the Last Year: Never true    Ran Out of Food in the Last Year: Never true  Transportation Needs: No Transportation Needs (08/14/2022)   PRAPARE - Administrator, Civil Service (Medical): No    Lack of Transportation (Non-Medical): No  Physical Activity: Not on file  Stress: Not  on file  Social Connections: Not on file     Family History: The patient's family history includes Brain cancer in his brother; Lung cancer in his brother, mother, and another family member.  ROS:   Please see the history of present illness.     All other systems reviewed and are negative.  EKGs/Labs/Other Studies Reviewed:    The following studies were reviewed today: CLINICAL DATA:  72 year old Caucasian male with history of hypertension.   * Tracking Code: FCC *   EXAM: CT CARDIAC CORONARY ARTERY CALCIUM SCORE   TECHNIQUE: Non-contrast imaging through the heart was performed using prospective ECG gating. Image post processing was performed on an independent workstation, allowing for quantitative analysis of the heart and coronary arteries. Note that this exam targets the  heart and the chest was not imaged in its entirety.   COMPARISON:  Chest CTA 06/02/2019.   FINDINGS: CORONARY CALCIUM SCORES:   Left Main: 3   LAD: 550   LCx: 104   RCA: 1,739   Total Agatston Score: 2,396   MESA database percentile: 93rd   AORTA MEASUREMENTS:   Ascending Aorta: 4.1 cm   Descending Aorta:2.7 cm   OTHER FINDINGS:   Atherosclerotic calcifications in the thoracic aorta. Within the visualized portions of the thorax there are no suspicious appearing pulmonary nodules or masses, there is no acute consolidative airspace disease, no pleural effusions, no pneumothorax and no lymphadenopathy. Visualized portions of the upper abdomen are unremarkable. There are no aggressive appearing lytic or blastic lesions noted in the visualized portions of the skeleton.   IMPRESSION: 1. Patient's total coronary artery calcium score is 2,396 which is 93rd percentile for patient's of matched age, gender and race/ethnicity. Please note that although the presence of coronary artery calcium documents the presence of coronary artery disease, the severity of this disease and any potential stenosis cannot be assessed on this noncontrast CT examination. Assessment for potential risk factor modification, dietary therapy or pharmacologic therapy may be warranted, if clinically indicated. 2. 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 Recommend annual imaging followup by CTA or MRA. This recommendation follows 2010 ACCF/AHA/AATS/ACR/ASA/SCA/SCAI/SIR/STS/SVM Guidelines for the Diagnosis and Management of Patients with Thoracic Aortic Disease. Circulation. 2010; 121: Y865-H846. Aortic aneurysm NOS (ICD10-I71.9).  EKG:  EKG is  ordered today.  The ekg ordered today demonstrates NSR rate 71. Normal. I have personally reviewed and interpreted this study.   Recent Labs: 12/03/2021: B Natriuretic Peptide 21.4; BUN 22; Creatinine, Ser 1.20;  Hemoglobin 12.7; Platelets 262; Potassium 3.4; Sodium 141  Recent Lipid Panel No results found for: "CHOL", "TRIG", "HDL", "CHOLHDL", "VLDL", "LDLCALC", "LDLDIRECT"  Dated this week: A1c 5.8%. cholesterol 174, triglycerides177, HDL 43, LDL 100. CBC, CMET and TSH normal.   Risk Assessment/Calculations:                Physical Exam:    VS:  BP 124/78   Pulse 71   Ht 5\' 11"  (1.803 m)   Wt 210 lb 12.8 oz (95.6 kg)   SpO2 95%   BMI 29.40 kg/m     Wt Readings from Last 3 Encounters:  08/18/22 210 lb 12.8 oz (95.6 kg)  08/14/22 211 lb 4 oz (95.8 kg)  12/28/21 215 lb 9.6 oz (97.8 kg)     GEN:  Well nourished, well developed in no acute distress HEENT: Normal NECK: No JVD; No carotid bruits LYMPHATICS: No lymphadenopathy CARDIAC: RRR, no murmurs, rubs, gallops RESPIRATORY:  Clear to auscultation  without rales, wheezing or rhonchi  ABDOMEN: Soft, non-tender, non-distended MUSCULOSKELETAL:  1+ edema, varicose veins; No deformity  SKIN: Warm and dry NEUROLOGIC:  Alert and oriented x 3 PSYCHIATRIC:  Normal affect   ASSESSMENT:    1. Coronary artery calcification   2. Chest pain, precordial   3. Hypercholesteremia   4. Primary hypertension    PLAN:    In order of problems listed above:  Patient has a very high calcium score. He has had coronary calcification for a number of years based on CT. He is having some mild chest pain symptoms and SOB. I have recommended a stress Myoview to assess CV risk. If low risk would recommend medical therapy. If high risk consider cardiac cath. Continue ASA, statin.  HTN controlled on lisinopril HCT HLD. Goal LDL < 70 and preferably < 55. Will switch simvastatin to Crestor 40 mg daily. Will need repeat lab in 3 months.  Prostate CA. Decision concerning treatment pending.       Shared Decision Making/Informed Consent The risks [chest pain, shortness of breath, cardiac arrhythmias, dizziness, blood pressure fluctuations, myocardial  infarction, stroke/transient ischemic attack, nausea, vomiting, allergic reaction, radiation exposure, metallic taste sensation and life-threatening complications (estimated to be 1 in 10,000)], benefits (risk stratification, diagnosing coronary artery disease, treatment guidance) and alternatives of a nuclear stress test were discussed in detail with Mr. Vonbank and he agrees to proceed.    Medication Adjustments/Labs and Tests Ordered: Current medicines are reviewed at length with the patient today.  Concerns regarding medicines are outlined above.  Orders Placed This Encounter  Procedures   Cardiac Stress Test: Informed Consent Details: Physician/Practitioner Attestation; Transcribe to consent form and obtain patient signature   EKG 12-Lead   Meds ordered this encounter  Medications   rosuvastatin (CRESTOR) 40 MG tablet    Sig: Take 1 tablet (40 mg total) by mouth daily.    Dispense:  90 tablet    Refill:  3    There are no Patient Instructions on file for this visit.   Signed, Tierra Thoma Swaziland, MD  08/18/2022 2:27 PM    [facility],

## 2022-08-18 ENCOUNTER — Ambulatory Visit: Payer: Medicare HMO | Attending: Cardiology | Admitting: Cardiology

## 2022-08-18 ENCOUNTER — Encounter: Payer: Self-pay | Admitting: Cardiology

## 2022-08-18 ENCOUNTER — Inpatient Hospital Stay: Payer: Medicare HMO | Attending: Radiation Oncology | Admitting: General Practice

## 2022-08-18 VITALS — BP 124/78 | HR 71 | Ht 71.0 in | Wt 210.8 lb

## 2022-08-18 DIAGNOSIS — C61 Malignant neoplasm of prostate: Secondary | ICD-10-CM | POA: Diagnosis not present

## 2022-08-18 DIAGNOSIS — I10 Essential (primary) hypertension: Secondary | ICD-10-CM | POA: Diagnosis not present

## 2022-08-18 DIAGNOSIS — I2584 Coronary atherosclerosis due to calcified coronary lesion: Secondary | ICD-10-CM | POA: Diagnosis not present

## 2022-08-18 DIAGNOSIS — R072 Precordial pain: Secondary | ICD-10-CM | POA: Diagnosis not present

## 2022-08-18 DIAGNOSIS — I2510 Atherosclerotic heart disease of native coronary artery without angina pectoris: Secondary | ICD-10-CM | POA: Diagnosis not present

## 2022-08-18 DIAGNOSIS — E7800 Pure hypercholesterolemia, unspecified: Secondary | ICD-10-CM

## 2022-08-18 MED ORDER — ROSUVASTATIN CALCIUM 40 MG PO TABS: 40.0000 mg | ORAL_TABLET | Freq: Every day | ORAL | 3 refills | Status: DC

## 2022-08-18 NOTE — Progress Notes (Signed)
[facility] Spiritual Care Note  Assisted Mr Bayerl in completing his Advance Directives.   He has chosen [employer] [phone]) to serve as his health care agent.  Original and copies given to Mr Winkfield for heath care agent and provider outside of [facility]. Copy to Health Information Management for scanning into electronic medical record.   [address] [place], [place], [facility] Pager [phone] Voicemail [phone]

## 2022-08-18 NOTE — Patient Instructions (Signed)
Medication Instructions:  Stop Simvastatin Start Rosuvastatin 40 mg daily Continue all other medications   Lab Work: None ordered   Testing/Procedures: Schedule Stress ( Exercise ) Myoview   Follow-Up: At [facility], you and your health needs are our priority.  As part of our continuing mission to provide you with exceptional heart care, we have created designated Provider Care Teams.  These Care Teams include your primary Cardiologist (physician) and Advanced Practice Providers (APPs -  Physician Assistants and Nurse Practitioners) who all work together to provide you with the care you need, when you need it.  We recommend signing up for the patient portal called "MyChart".  Sign up information is provided on this After Visit Summary.  MyChart is used to connect with patients for Virtual Visits (Telemedicine).  Patients are able to view lab/test results, encounter notes, upcoming appointments, etc.  Non-urgent messages can be sent to your provider as well.   To learn more about what you can do with MyChart, go to [email or web address].    Your next appointment:  After test    Provider:  Dr.Jordan

## 2022-08-22 ENCOUNTER — Telehealth (HOSPITAL_COMMUNITY): Payer: Self-pay | Admitting: *Deleted

## 2022-08-22 NOTE — Telephone Encounter (Signed)
Left detailed instructions for MPI study. 

## 2022-08-23 ENCOUNTER — Ambulatory Visit (HOSPITAL_COMMUNITY): Payer: Medicare HMO | Attending: Cardiology

## 2022-08-23 ENCOUNTER — Ambulatory Visit (HOSPITAL_COMMUNITY): Payer: Medicare HMO

## 2022-08-23 ENCOUNTER — Encounter (HOSPITAL_COMMUNITY): Payer: Self-pay

## 2022-08-23 ENCOUNTER — Other Ambulatory Visit (HOSPITAL_COMMUNITY): Payer: Self-pay | Admitting: Cardiology

## 2022-08-23 DIAGNOSIS — I2510 Atherosclerotic heart disease of native coronary artery without angina pectoris: Secondary | ICD-10-CM

## 2022-08-23 DIAGNOSIS — I2584 Coronary atherosclerosis due to calcified coronary lesion: Secondary | ICD-10-CM | POA: Diagnosis not present

## 2022-08-23 DIAGNOSIS — R079 Chest pain, unspecified: Secondary | ICD-10-CM | POA: Insufficient documentation

## 2022-08-23 LAB — MYOCARDIAL PERFUSION IMAGING
Angina Index: 0
Base ST Depression (mm): 0 mm
Duke Treadmill Score: 5
Estimated workload: 7
Exercise duration (min): 5 min
Exercise duration (sec): 11 s
LV dias vol: 80 mL (ref 62–150)
LV sys vol: 31 mL
MPHR: 147 {beats}/min
Nuc Stress EF: 61 %
Peak HR: 150 {beats}/min
Percent HR: 102 %
Rest HR: 67 {beats}/min
Rest Nuclear Isotope Dose: 11 mCi
SDS: 0
SRS: 0
SSS: 0
ST Depression (mm): 0 mm
Stress Nuclear Isotope Dose: 31.1 mCi
TID: 1.12

## 2022-08-23 MED ORDER — TECHNETIUM TC 99M TETROFOSMIN IV KIT
31.1000 | PACK | Freq: Once | INTRAVENOUS | Status: AC | PRN
  Administered 2022-08-23: 31.1 via INTRAVENOUS

## 2022-08-23 MED ORDER — TECHNETIUM TC 99M TETROFOSMIN IV KIT
11.0000 | PACK | Freq: Once | INTRAVENOUS | Status: AC | PRN
  Administered 2022-08-23: 11 via INTRAVENOUS

## 2022-08-31 ENCOUNTER — Encounter (HOSPITAL_COMMUNITY)
Admission: RE | Admit: 2022-08-31 | Discharge: 2022-08-31 | Disposition: A | Discharge status: Home / Self Care | Payer: Medicare HMO | Source: Ambulatory Visit | Attending: Urology | Admitting: Urology

## 2022-08-31 DIAGNOSIS — C61 Malignant neoplasm of prostate: Secondary | ICD-10-CM | POA: Diagnosis not present

## 2022-08-31 DIAGNOSIS — R310 Gross hematuria: Secondary | ICD-10-CM | POA: Diagnosis not present

## 2022-08-31 DIAGNOSIS — K760 Fatty (change of) liver, not elsewhere classified: Secondary | ICD-10-CM | POA: Diagnosis not present

## 2022-08-31 DIAGNOSIS — R319 Hematuria, unspecified: Secondary | ICD-10-CM | POA: Diagnosis not present

## 2022-08-31 MED ORDER — TECHNETIUM TC 99M MEDRONATE IV KIT
20.0000 | PACK | Freq: Once | INTRAVENOUS | Status: AC | PRN
  Administered 2022-08-31: 22 via INTRAVENOUS

## 2022-09-04 NOTE — Progress Notes (Unsigned)
   Cardiology Clinic Note   Date: 09/06/2022 ID: Brondon, Wann 28-Oct-1950, MRN [id number]  Primary Cardiologist:  Peter Swaziland, MD  Patient Profile    Lawrence Scott is a 72 y.o. male who presents to the clinic today for follow up after cardiac testing.   Past medical history significant for: Nonobstructive CAD. CT cardiac scoring 08/11/2022: Calcium score 2396 (93rd percentile).  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 Nuclear stress test 08/23/2022: No ST deviation noted.  LV perfusion is normal.  No evidence of ischemia or infarction.  LVEF 55 to 60%. Hypertension. Hyperlipidemia. Lipid panel 07/04/2022: LDL 100, HDL 43, TG 177, total 174.  Prostate cancer. Chronic venous insufficiency.   History of Present Illness    Lawrence Scott was first evaluated by Dr. Swaziland on 08/18/2022 for atherosclerosis/CAD at the request of Dr. Abigail Miyamoto.  Patient had coronary calcium score of 2396.  He reported occasional chest pain not clearly exertional but did note more shortness of breath.  Today, patient is doing well. Patient denies shortness of breath or dyspnea on exertion. No chest pain, pressure, or tightness. Denies lower extremity edema, orthopnea, or PND. No palpitations.  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 Discussed recent nuclear stress testing and goal of reducing risk factors.     ROS: All other systems reviewed and are otherwise negative except as noted in History of Present Illness.  Studies Reviewed    ECG is not ordered today.         Physical Exam    VS:  BP 134/86   Pulse 63   Ht 5\' 11"  (1.803 m)   Wt 206 lb 9.6 oz (93.7 kg)   SpO2 100%   BMI 28.81 kg/m  , BMI Body mass index is 28.81 kg/m.  GEN: Well nourished, well developed, in no acute distress. Neck: No JVD or carotid bruits. Cardiac:  RRR. No murmurs. No  rubs or gallops.   Respiratory:  Respirations regular and unlabored. Clear to auscultation without rales, wheezing or rhonchi. GI: Soft, nontender, nondistended. Extremities: Radials/DP/PT 2+ and equal bilaterally. No clubbing or cyanosis. No edema.  Skin: Warm and dry, no rash. Neuro: Strength intact.  Assessment & Plan    Nonobstructive CAD.  Coronary calcium score May 2024 2396.  Nuclear stress test May 2024 no ischemia or infarction.  Patient denies chest pain, tightness, pressure. 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  Continue aspirin, rosuvastatin. Hypertension. BP today 134/86 on intake and 128/70 on my recheck.  Patient denies headaches, dizziness or vision changes. Continue Zestoretic. Hyperlipidemia. LDL April 2024 100, not at goal. Rosuvastatin was increased in May 2024. Patient will return for fasting lipids at the end of July.   Disposition: Lipid panel and LFTs end of July. Return in 6 months or sooner as needed.          Signed, Etta Grandchild. Calden Dorsey, DNP, NP-C

## 2022-09-05 DIAGNOSIS — D414 Neoplasm of uncertain behavior of bladder: Secondary | ICD-10-CM | POA: Diagnosis not present

## 2022-09-05 DIAGNOSIS — C61 Malignant neoplasm of prostate: Secondary | ICD-10-CM | POA: Diagnosis not present

## 2022-09-06 ENCOUNTER — Encounter: Payer: Self-pay | Admitting: Student

## 2022-09-06 ENCOUNTER — Ambulatory Visit: Payer: Medicare HMO | Attending: Student | Admitting: Student

## 2022-09-06 VITALS — BP 134/86 | HR 63 | Ht 71.0 in | Wt 206.6 lb

## 2022-09-06 DIAGNOSIS — E785 Hyperlipidemia, unspecified: Secondary | ICD-10-CM | POA: Diagnosis not present

## 2022-09-06 DIAGNOSIS — I10 Essential (primary) hypertension: Secondary | ICD-10-CM

## 2022-09-06 DIAGNOSIS — I2510 Atherosclerotic heart disease of native coronary artery without angina pectoris: Secondary | ICD-10-CM | POA: Diagnosis not present

## 2022-09-06 DIAGNOSIS — E7800 Pure hypercholesterolemia, unspecified: Secondary | ICD-10-CM | POA: Diagnosis not present

## 2022-09-06 NOTE — Patient Instructions (Signed)
Medication Instructions:  Your physician recommends that you continue on your current medications as directed. Please refer to the Current Medication list given to you today.  *If you need a refill on your cardiac medications before your next appointment, please call your pharmacy*   Lab Work: Your physician recommends that you return the ending of July to have the following labs drawn: Lipid panel and Lft's  If you have labs (blood work) drawn today and your tests are completely normal, you will receive your results only by: MyChart Message (if you have MyChart) OR A paper copy in the mail If you have any lab test that is abnormal or we need to change your treatment, we will call you to review the results.   Testing/Procedures: NONE   Follow-Up: At [facility], you and your health needs are our priority.  As part of our continuing mission to provide you with exceptional heart care, we have created designated Provider Care Teams.  These Care Teams include your primary Cardiologist (physician) and Advanced Practice Providers (APPs -  Physician Assistants and Nurse Practitioners) who all work together to provide you with the care you need, when you need it.  We recommend signing up for the patient portal called "MyChart".  Sign up information is provided on this After Visit Summary.  MyChart is used to connect with patients for Virtual Visits (Telemedicine).  Patients are able to view lab/test results, encounter notes, upcoming appointments, etc.  Non-urgent messages can be sent to your provider as well.   To learn more about what you can do with MyChart, go to [email or web address].    Your next appointment:   6 month(s)  Provider:   Peter Swaziland, MD

## 2022-09-12 DIAGNOSIS — C61 Malignant neoplasm of prostate: Secondary | ICD-10-CM

## 2022-09-12 NOTE — Addendum Note (Signed)
Addended by: Deberah Castle on: 09/12/2022 10:45 AM   Modules accepted: Orders

## 2022-09-14 DIAGNOSIS — C61 Malignant neoplasm of prostate: Secondary | ICD-10-CM | POA: Diagnosis not present

## 2022-09-14 NOTE — Progress Notes (Signed)
Patient was a consult on 5/20 for his stage T1c adenocarcinoma of the prostate with Gleason score of 4+3, and PSA of 6.82.  Patient has confirmed he would like to proceed with 5.5 weeks of daily radiation.    Patient has since completed his staging imaging and had an incidental finding of possible bladder lesion and will undergo a cystoscopy on 6/20 to rule out a possible bladder tumor.  Patient had cysto and confirmed very small median lobe with no abnormalities vs bladder lesion.   Plan of care in progress.

## 2022-09-18 ENCOUNTER — Telehealth: Payer: Self-pay | Admitting: *Deleted

## 2022-09-18 NOTE — Telephone Encounter (Signed)
   Pre-operative Risk Assessment    Patient Name: Lawrence Scott  DOB: 10-28-1950 MRN: [id number]      Request for Surgical Clearance    Procedure:   Fiducial markers and space oar.   Date of Surgery:  Clearance TBD                                 Surgeon:  Dr. Rhoderick Moody Surgeon's Group or Practice Name:  [facility] Phone number:  [phone] Fax number:  [phone]   Type of Clearance Requested:   - Medical  - Pharmacy:  Hold Aspirin 5 days prior.    Type of Anesthesia:  General    Additional requests/questions:  Patient had appointment with Carlos Levering, NP on June 12,2024.  Signed, Emmit Pomfret   09/18/2022, 9:54 AM

## 2022-09-18 NOTE — Telephone Encounter (Signed)
     Primary Cardiologist: Peter Swaziland, MD  Chart reviewed as part of pre-operative protocol coverage. Given past medical history and time since last visit, based on ACC/AHA guidelines, ADREAN HEITZ would be at acceptable risk for the planned procedure without further cardiovascular testing.   Patient's aspirin is not prescribed by cardiology provider.  Recommendations for holding aspirin will need to come from prescribing provider.  I will route this recommendation to the requesting party via Epic fax function and remove from pre-op pool.  Please call with questions.  Thomasene Ripple. Davonne Jarnigan NP-C     09/18/2022, 10:08 AM [facility] [address] Office [phone] Fax [phone]

## 2022-09-19 ENCOUNTER — Inpatient Hospital Stay: Payer: Medicare HMO | Attending: Radiation Oncology | Admitting: Licensed Clinical Social Worker

## 2022-09-19 DIAGNOSIS — C61 Malignant neoplasm of prostate: Secondary | ICD-10-CM

## 2022-09-19 NOTE — Progress Notes (Signed)
Pt has verbalized his concerns with finances during treatment.  Pt has limited income and does not feel he will be able to afford treatment.  RN will reach out to our social workers for assessment and follow up with patient once assessment complete.

## 2022-09-19 NOTE — Progress Notes (Signed)
[facility] Clinical Social Work  Initial Assessment   Lawrence Scott is a 72 y.o. year old male contacted by phone. Clinical Social Work was referred by medical provider for assessment of psychosocial needs.   SDOH (Social Determinants of Health) assessments performed: Yes   SDOH Screenings   Food Insecurity: No Food Insecurity (08/14/2022)  Housing: Low Risk  (08/14/2022)  Transportation Needs: No Transportation Needs (08/14/2022)  Utilities: Not At Risk (08/14/2022)  Alcohol Screen: Low Risk  (08/14/2022)  Depression (PHQ2-9): Low Risk  (08/14/2022)  Tobacco Use: Unknown (09/06/2022)     Distress Screen completed: No     No data to display            Family/Social Information:  Housing Arrangement: patient lives alone Family members/support persons in your life? 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  Income source: Actor concerns: Yes, due to illness and/or loss of work during treatment Type of concern: Medical bills Food access concerns: no Religious or spiritual practice: Not known Services Currently in place:  none  Coping/ Adjustment to diagnosis: Patient understands treatment plan and what happens next? yes Concerns about diagnosis and/or treatment: Overwhelmed by information and How I will pay for the services I need Patient reported stressors: Finances Hopes and/or priorities: Pt's priority is to figure out how to pay for the out of pocket expenses of undergoing treatment with the hope of being able to undergo treatment Patient enjoys time with family/ friends Current coping skills/ strengths: Capable of independent living , Manufacturing systems engineer , Motivation for treatment/growth , and Physical Health     SUMMARY: Current SDOH Barriers:  Financial constraints related to fixed income  Clinical Social Work  Clinical Goal(s):  Explore community resource options for unmet needs related to:  Financial Strain   Interventions: Discussed common feeling and emotions when being diagnosed with cancer, and the importance of support during treatment Informed patient of the support team roles and support services at [facility] Provided CSW contact information and encouraged patient to call with any questions or concerns Referred patient to patient financial resource specialist to apply for the [employer].  Pt to meet w/ CSW 6/26 in the office to contact insurance to determine if pt has managed Medicare or a supplement to better understand what his out of pocket expenses may be.  CSW then to contact billing w/ pt to assist with assessing his current cumulative bill.    Follow Up Plan: CSW will see patient on 6/26 Patient verbalizes understanding of plan: Yes    Rachel Moulds, LCSW Clinical Social Worker [facility]

## 2022-09-20 ENCOUNTER — Inpatient Hospital Stay: Payer: Medicare HMO | Admitting: Licensed Clinical Social Worker

## 2022-09-20 ENCOUNTER — Other Ambulatory Visit: Payer: Self-pay

## 2022-09-20 DIAGNOSIS — C61 Malignant neoplasm of prostate: Secondary | ICD-10-CM

## 2022-09-20 NOTE — Progress Notes (Signed)
[facility] CSW Progress Note  Visual merchandiser met with patient to discuss concerns regarding medical bills.  CSW contacted Monia Pouch w/ pt who confirmed pt has a managed Medicare plan without a supplement.  Pt believes he had a supplemental insurance when his plan was managed by [facility], but that it was changed when Monia Pouch took over the policy.  Pt will contact his insurance broker to check if he does in fact have a supplemental policy and will submit that information if he does.  If not pt is aware he will be responsible for 20% of his medical bills.  Pt will receive a proposal through the mail regarding the amount he will be responsible for to receive radiation treatment.  Pt will contact the navigator to book radiation once he knows the amount he will be responsible for.  Pt also provided with contact details for the patient financial resource specialist to apply for the [employer].      Rachel Moulds, LCSW Clinical Social Worker [facility]

## 2022-09-21 ENCOUNTER — Encounter: Payer: Self-pay | Admitting: Radiation Oncology

## 2022-09-21 NOTE — Progress Notes (Signed)
Pt called inquiring about the [employer].  I informed him in order to qualify for the grant he has to be in active treatment and have to qualify based on yearly income so as of right now he doesn't qualify but once a treatment plan has been established I will reach out to him regarding the grant.  He verbalized understanding.

## 2022-09-21 NOTE — Progress Notes (Signed)
RN spoke with patient to follow up from having appointment with CSW.  Patient is agreeable to move forward with treatment and will wait for proposed billing amount. Pt verbalized understanding that he will be responsible for 20%, however, understands that once he meets deductible insurance should cover 100%.  He will also apply for [employer] once treatment is started.  Pt is currently waiting for a call back from [facility] to schedule for fiducial marker's and spaceOAR gel.    Plan of care in progress.

## 2022-09-22 ENCOUNTER — Other Ambulatory Visit: Payer: Self-pay | Admitting: Urology

## 2022-09-22 ENCOUNTER — Encounter (HOSPITAL_BASED_OUTPATIENT_CLINIC_OR_DEPARTMENT_OTHER): Payer: Self-pay | Admitting: Urology

## 2022-09-25 ENCOUNTER — Encounter (HOSPITAL_BASED_OUTPATIENT_CLINIC_OR_DEPARTMENT_OTHER): Payer: Self-pay | Admitting: Urology

## 2022-09-25 NOTE — Progress Notes (Addendum)
Spoke w/ via phone for pre-op interview---pt Lab needs dos---- [facility] results------ current in EKG in epic/ chart COVID test -----patient states asymptomatic no test needed Arrive at ------- 0800 on 10-03-2022 NPO after MN NO Solid Food.  Clear liquids from MN until--- 0700 Med rec completed Medications to take morning of surgery ----- none Diabetic medication ----- n/a Patient instructed no nail polish to be worn day of surgery Patient instructed to bring photo id and insurance card day of surgery Patient aware to have Driver (ride ) / caregiver    for 24 hours after surgery -- daughter, sherry Patient Special Instructions -----  will do one fleet enema night before surgery Pre-Op special Instructions ----- pt has tele cardiac clearance by Vergia Alcon PA on 09-18-2022 in epic/ chart. Pt stated was given instructions to stop ASA 5 days prior to surgery by Dr Liliane Shi office. Patient verbalized understanding of instructions that were given at this phone interview. Patient denies shortness of breath, chest pain, fever, cough at this phone interview.

## 2022-10-03 ENCOUNTER — Ambulatory Visit (HOSPITAL_BASED_OUTPATIENT_CLINIC_OR_DEPARTMENT_OTHER)
Admission: RE | Admit: 2022-10-03 | Discharge: 2022-10-03 | Disposition: A | Discharge status: Home / Self Care | Payer: Medicare HMO | Attending: Urology | Admitting: Urology

## 2022-10-03 ENCOUNTER — Other Ambulatory Visit: Payer: Self-pay

## 2022-10-03 ENCOUNTER — Encounter (HOSPITAL_BASED_OUTPATIENT_CLINIC_OR_DEPARTMENT_OTHER)
Admission: RE | Disposition: A | Discharge status: Home / Self Care | Payer: Self-pay | Source: Home / Self Care | Attending: Urology

## 2022-10-03 ENCOUNTER — Encounter (HOSPITAL_BASED_OUTPATIENT_CLINIC_OR_DEPARTMENT_OTHER): Payer: Self-pay | Admitting: Urology

## 2022-10-03 ENCOUNTER — Ambulatory Visit (HOSPITAL_BASED_OUTPATIENT_CLINIC_OR_DEPARTMENT_OTHER): Payer: Medicare HMO | Admitting: Certified Registered Nurse Anesthetist

## 2022-10-03 DIAGNOSIS — I10 Essential (primary) hypertension: Secondary | ICD-10-CM | POA: Insufficient documentation

## 2022-10-03 DIAGNOSIS — C61 Malignant neoplasm of prostate: Secondary | ICD-10-CM | POA: Diagnosis not present

## 2022-10-03 DIAGNOSIS — K449 Diaphragmatic hernia without obstruction or gangrene: Secondary | ICD-10-CM | POA: Insufficient documentation

## 2022-10-03 DIAGNOSIS — M1990 Unspecified osteoarthritis, unspecified site: Secondary | ICD-10-CM | POA: Insufficient documentation

## 2022-10-03 DIAGNOSIS — I700 Atherosclerosis of aorta: Secondary | ICD-10-CM | POA: Insufficient documentation

## 2022-10-03 DIAGNOSIS — I2510 Atherosclerotic heart disease of native coronary artery without angina pectoris: Secondary | ICD-10-CM | POA: Diagnosis not present

## 2022-10-03 DIAGNOSIS — Z79899 Other long term (current) drug therapy: Secondary | ICD-10-CM | POA: Diagnosis not present

## 2022-10-03 DIAGNOSIS — E7800 Pure hypercholesterolemia, unspecified: Secondary | ICD-10-CM | POA: Diagnosis not present

## 2022-10-03 DIAGNOSIS — Z01818 Encounter for other preprocedural examination: Secondary | ICD-10-CM

## 2022-10-03 HISTORY — PX: GOLD SEED IMPLANT: SHX6343

## 2022-10-03 HISTORY — DX: Other allergy status, other than to drugs and biological substances: Z91.09

## 2022-10-03 HISTORY — DX: Diaphragmatic hernia without obstruction or gangrene: K44.9

## 2022-10-03 HISTORY — DX: Deficiency of other specified B group vitamins: E53.8

## 2022-10-03 HISTORY — DX: Hereditary and idiopathic neuropathy, unspecified: G60.9

## 2022-10-03 HISTORY — DX: Unspecified osteoarthritis, unspecified site: M19.90

## 2022-10-03 HISTORY — PX: SPACE OAR INSTILLATION: SHX6769

## 2022-10-03 HISTORY — DX: Allergic rhinitis, unspecified: J30.9

## 2022-10-03 HISTORY — DX: Venous insufficiency (chronic) (peripheral): I87.2

## 2022-10-03 HISTORY — DX: Presence of external hearing-aid: Z97.4

## 2022-10-03 LAB — POCT I-STAT, CHEM 8
BUN: 17 mg/dL (ref 8–23)
Calcium, Ion: 1.3 mmol/L (ref 1.15–1.40)
Chloride: 105 mmol/L (ref 98–111)
Creatinine, Ser: 0.9 mg/dL (ref 0.61–1.24)
Glucose, Bld: 96 mg/dL (ref 70–99)
HCT: 39 % (ref 39.0–52.0)
Hemoglobin: 13.3 g/dL (ref 13.0–17.0)
Potassium: 3.8 mmol/L (ref 3.5–5.1)
Sodium: 140 mmol/L (ref 135–145)
TCO2: 24 mmol/L (ref 22–32)

## 2022-10-03 SURGERY — INSERTION, GOLD SEEDS
Anesthesia: Monitor Anesthesia Care | Site: Prostate

## 2022-10-03 MED ORDER — SODIUM CHLORIDE 0.9 % IV SOLN
2.0000 g | INTRAVENOUS | Status: AC
  Administered 2022-10-03: 2 g via INTRAVENOUS

## 2022-10-03 MED ORDER — AMISULPRIDE (ANTIEMETIC) 5 MG/2ML IV SOLN: 10.0000 mg | Freq: Once | INTRAVENOUS | Status: DC | PRN

## 2022-10-03 MED ORDER — SODIUM CHLORIDE 0.9 % IV SOLN: INTRAVENOUS | Status: DC

## 2022-10-03 MED ORDER — BUPIVACAINE HCL (PF) 0.25 % IJ SOLN
INTRAMUSCULAR | Status: DC | PRN
  Administered 2022-10-03: 10 mL

## 2022-10-03 MED ORDER — CEFTRIAXONE SODIUM 2 G IJ SOLR
INTRAMUSCULAR | Status: AC
  Filled 2022-10-03: qty 20

## 2022-10-03 MED ORDER — FENTANYL CITRATE (PF) 100 MCG/2ML IJ SOLN: 25.0000 ug | INTRAMUSCULAR | Status: DC | PRN

## 2022-10-03 MED ORDER — LIDOCAINE 2% (20 MG/ML) 5 ML SYRINGE
INTRAMUSCULAR | Status: DC | PRN
  Administered 2022-10-03: 40 mg via INTRAVENOUS

## 2022-10-03 MED ORDER — OXYCODONE HCL 5 MG PO TABS: 5.0000 mg | ORAL_TABLET | Freq: Once | ORAL | Status: DC | PRN

## 2022-10-03 MED ORDER — LACTATED RINGERS IV SOLN: INTRAVENOUS | Status: DC

## 2022-10-03 MED ORDER — SODIUM CHLORIDE (PF) 0.9 % IJ SOLN
INTRAMUSCULAR | Status: DC | PRN
  Administered 2022-10-03: 10 mL

## 2022-10-03 MED ORDER — SODIUM CHLORIDE 0.9 % IV SOLN
INTRAVENOUS | Status: AC
  Filled 2022-10-03: qty 100

## 2022-10-03 MED ORDER — DEXMEDETOMIDINE HCL IN NACL 80 MCG/20ML IV SOLN
INTRAVENOUS | Status: DC | PRN
  Administered 2022-10-03: 12 ug via INTRAVENOUS

## 2022-10-03 MED ORDER — PROPOFOL 500 MG/50ML IV EMUL
INTRAVENOUS | Status: DC | PRN
  Administered 2022-10-03: 200 ug/kg/min via INTRAVENOUS

## 2022-10-03 MED ORDER — OXYCODONE HCL 5 MG/5ML PO SOLN: 5.0000 mg | Freq: Once | ORAL | Status: DC | PRN

## 2022-10-03 MED ORDER — PROPOFOL 10 MG/ML IV BOLUS
INTRAVENOUS | Status: DC | PRN
  Administered 2022-10-03 (×3): 20 mg via INTRAVENOUS

## 2022-10-03 SURGICAL SUPPLY — 25 items
BLADE CLIPPER SENSICLIP SURGIC (BLADE) ×2 IMPLANT
CNTNR URN SCR LID CUP LEK RST (MISCELLANEOUS) ×2 IMPLANT
CONT SPEC 4OZ STRL OR WHT (MISCELLANEOUS) ×1
COVER BACK TABLE 60X90IN (DRAPES) ×2 IMPLANT
DRSG TEGADERM 4X4.75 (GAUZE/BANDAGES/DRESSINGS) ×2 IMPLANT
DRSG TEGADERM 8X12 (GAUZE/BANDAGES/DRESSINGS) ×2 IMPLANT
GAUZE SPONGE 4X4 12PLY STRL (GAUZE/BANDAGES/DRESSINGS) ×2 IMPLANT
GLOVE BIO SURGEON STRL SZ7.5 (GLOVE) ×2 IMPLANT
GLOVE SURG ORTHO 8.5 STRL (GLOVE) ×2 IMPLANT
IMPL SPACEOAR VUE SYSTEM (Spacer) ×2 IMPLANT
IMPLANT SPACEOAR VUE SYSTEM (Spacer) ×1 IMPLANT
KIT TURNOVER CYSTO (KITS) ×2 IMPLANT
MARKER GOLD PRELOAD 1.2X3 (Urological Implant) ×2 IMPLANT
MARKER SKIN DUAL TIP RULER LAB (MISCELLANEOUS) ×2 IMPLANT
NDL SPNL 22GX3.5 QUINCKE BK (NEEDLE) ×2 IMPLANT
NEEDLE SPNL 22GX3.5 QUINCKE BK (NEEDLE) ×1 IMPLANT
SEED GOLD PRELOAD 1.2X3 (Urological Implant) ×1 IMPLANT
SHEATH ULTRASOUND LF (SHEATH) ×2 IMPLANT
SHEATH ULTRASOUND LTX NONSTRL (SHEATH) IMPLANT
SLEEVE SCD COMPRESS KNEE MED (STOCKING) ×2 IMPLANT
SURGILUBE 2OZ TUBE FLIPTOP (MISCELLANEOUS) ×2 IMPLANT
SYR 10ML LL (SYRINGE) IMPLANT
SYR CONTROL 10ML LL (SYRINGE) ×2 IMPLANT
TOWEL OR 17X24 6PK STRL BLUE (TOWEL DISPOSABLE) ×2 IMPLANT
UNDERPAD 30X36 HEAVY ABSORB (UNDERPADS AND DIAPERS) ×2 IMPLANT

## 2022-10-03 NOTE — H&P (Signed)
Office Visit Report     09/14/2022   --------------------------------------------------------------------------------   Lawrence Scott  MRN: [id number]  DOB: 1950-07-23, 72 year old Male  SSN:    PRIMARY CARE:  Chales Salmon. Sharyl Nimrod), MD  PRIMARY CARE FAX:  [phone]  REFERRING:  Azucena Kuba  PROVIDER:  Rhoderick Moody, M.D.  LOCATION:  [facility] [phone]     --------------------------------------------------------------------------------   CC/HPI: Prostate cancer   HPI: Lawrence Scott is a 72 year old male with T1c, Grade 3 prostate cancer and gross hematuria   Last PSA: 6.82 (06/2022), 5.72 (07/2021), 5.02 (01/2021), 4.85 (01/2021). Family history of prostate cancer involving his younger brother.   08/04/2022: The patient is here today to discuss his prostate biopsy pathology results, which revealed multifocal grade 1 through 3 prostate cancer. He was found to have a total of 7 out of 19 cores positive for prostate adenocarcinoma. The ROI involving the posterior right mid gland was biopsied 3 times and found to have 2 cores positive for grade 3 cancer in 1 core positive for grade 1 cancer. The remaining positive cores were also around the right base and mid gland. He is recovering well from his recent biopsy denies residual hematuria, dysuria or blood per rectum.   09/14/2022: The patient is here today for cystoscopy to complete his hematuria evaluation. He met with Dr. Laverle Patter to discuss robotic prostatectomy, but is leaning more towards external beam radiation. His recent CTU showed a questionable bladder lesion versus median lobe with no other signs of metastatic disease. His bone scan from 09/08/2022 was also negative.     ALLERGIES: None   MEDICATIONS: Allopurinol 100 mg tablet  Aspirin 1 PO Daily  Flonase Allergy Relief 50 mcg/actuation spray, suspension  Folic Acid  Lisinopril-Hydrochlorothiazide 20 mg-25 mg tablet  Multivitamin 1 PO  Daily  Rosuvastatin Calcium 40 mg tablet 1 tablet PO Daily  Vitamin B12     GU PSH: Locm 300-399Mg /Ml Iodine,1Ml - 08/31/2022 Prostate Needle Biopsy - 07/21/2022     NON-GU PSH: Surgical Pathology, Gross And Microscopic Examination For Prostate Needle - 07/21/2022     GU PMH: Bladder tumor/neoplasm - 09/05/2022 Prostate Cancer - 09/05/2022, - 08/04/2022 Gross hematuria - 08/31/2022, - 08/04/2022 Family Hx of Prostate Cancer - 08/04/2022 Elevated PSA - 07/21/2022, - 12/09/2021 BPH w/LUTS - 12/09/2021 Nocturia - 12/09/2021    NON-GU PMH: Arthritis Gout Hypercholesterolemia Hypertension    FAMILY HISTORY: 2 daughters - Daughter   SOCIAL HISTORY: Marital Status: Single Preferred Language: English; Ethnicity: Not Hispanic Or Latino; Race: White Current Smoking Status: Patient has never smoked.   Tobacco Use Assessment Completed: Used Tobacco in last 30 days? Light Drinker.  Drinks 3 caffeinated drinks per day.    REVIEW OF SYSTEMS:    GU Review Male:   Patient denies frequent urination, hard to postpone urination, burning/ pain with urination, get up at night to urinate, leakage of urine, stream starts and stops, trouble starting your stream, have to strain to urinate , erection problems, and penile pain.  Gastrointestinal (Upper):   Patient denies nausea, vomiting, and indigestion/ heartburn.  Gastrointestinal (Lower):   Patient denies diarrhea and constipation.  Constitutional:   Patient denies fever, night sweats, weight loss, and fatigue.  Skin:   Patient denies skin rash/ lesion and itching.  Eyes:   Patient denies blurred vision and double vision.  Ears/ Nose/ Throat:   Patient denies sore throat and sinus problems.  Hematologic/Lymphatic:   Patient denies  swollen glands and easy bruising.  Cardiovascular:   Patient denies leg swelling and chest pains.  Respiratory:   Patient denies cough and shortness of breath.  Endocrine:   Patient denies excessive thirst.  Musculoskeletal:    Patient denies back pain and joint pain.  Neurological:   Patient denies headaches and dizziness.  Psychologic:   Patient denies depression and anxiety.   VITAL SIGNS: None   Complexity of Data:  Lab Test Review:   PSA  Records Review:   Previous Patient Records  X-Ray Review: C.T. Hematuria: Reviewed Films. Reviewed Report. Discussed With Patient.  Bone Scan: Reviewed Films. Reviewed Report. Discussed With Patient.     07/21/22  PSA  Total PSA 6.82 ng/mL   Notes:                     CLINICAL DATA: Prostate cancer, gross hematuria.   EXAM:  CT ABDOMEN AND PELVIS WITHOUT AND WITH CONTRAST   TECHNIQUE:  Multidetector CT imaging of the abdomen and pelvis was performed  following the standard protocol before and following the bolus  administration of intravenous contrast.   RADIATION DOSE REDUCTION: This exam was performed according to the  departmental dose-optimization program which includes automated  exposure control, adjustment of the mA and/or kV according to  patient size and/or use of iterative reconstruction technique.   CONTRAST: 125 cc Omnipaque 300.   COMPARISON: 06/02/2019.   FINDINGS:  Lower chest: Lung bases are clear. Atherosclerotic calcification of  the aortic valve and coronary arteries. Heart is enlarged. No  pericardial or pleural effusion. Distal esophagus is grossly  unremarkable.   Hepatobiliary: Liver is decreased in attenuation diffusely and the  liver is enlarged, 21.0 cm. Liver and gallbladder are otherwise  unremarkable. No biliary ductal dilatation.   Pancreas: Negative.   Spleen: Negative.   Adrenals/Urinary Tract: Adrenal glands and kidneys are unremarkable.  No urinary stones. Ureters are decompressed. Portions of the distal  right ureter are poorly opacified, limiting evaluation. Otherwise,  no filling defects in the opacified portions of the intrarenal  collecting systems and ureters. An 11 x 12 mm enhancing nodule seen  along the  posterior bladder wall (5/93).   Stomach/Bowel: Stomach is unremarkable. Small duodenal diverticulum.  Small bowel is otherwise unremarkable. Appendix extends into a right  inguinal hernia. Stool is seen in the majority of the colon.   Vascular/Lymphatic: Atherosclerotic calcification of the aorta. No  pathologically enlarged lymph nodes.   Reproductive: Prostate is visualized.   Other: Small bilateral inguinal hernias contain fat as well as the  appendix on the right. No free fluid. Mesenteries and peritoneum are  unremarkable.   Musculoskeletal: Degenerative changes in the spine. No worrisome  lytic or sclerotic lesions.   IMPRESSION:  1. Enhancing posterior bladder wall nodule, compatible with  urothelial carcinoma.  2. No evidence of metastatic prostate cancer.  3. Steatotic enlarged liver.  4. Stool throughout the colon is indicative of constipation.  5. Small bilateral inguinal hernias contain fat as well as the  appendix on the right.  6. Aortic atherosclerosis (ICD10-I70.0). Coronary artery  calcification.    Electronically Signed  By: Leanna Battles M.D.  On: 09/04/2022 11:51    PROCEDURES:         Flexible Cystoscopy - 52000  Risks, benefits, and some of the potential complications of the procedure were discussed at length with the patient including infection, bleeding, voiding discomfort, urinary retention, fever, chills, sepsis, and others. All questions were answered.  Informed consent was obtained. Antibiotic prophylaxis was given. Sterile technique and intraurethral analgesia were used.  Meatus:  Normal size. Normal location. Normal condition.  Urethra:  No strictures.  External Sphincter:  Normal.  Verumontanum:  Normal.  Prostate:  Small median lobe. No hyperplasia.  Bladder Neck:  Non-obstructing.  Ureteral Orifices:  Normal location. Normal size. Normal shape. Effluxed clear urine.  Bladder:  No trabeculation. No tumors. Normal mucosa. No stones.       The lower urinary tract was carefully examined. The procedure was well-tolerated and without complications. Antibiotic instructions were given. Instructions were given to call the office immediately for bloody urine, difficulty urinating, urinary retention, painful or frequent urination, fever, chills, nausea, vomiting or other illness. The patient stated that he understood these instructions and would comply with them.         Urinalysis Dipstick Dipstick Cont'd  Color: Yellow Bilirubin: Neg mg/dL  Appearance: Clear Ketones: Neg mg/dL  Specific Gravity: 1.610 Blood: Neg ery/uL  pH: 6.5 Protein: Neg mg/dL  Glucose: Neg mg/dL Urobilinogen: 0.2 mg/dL    Nitrites: Neg    Leukocyte Esterase: Neg leu/uL    ASSESSMENT:      ICD-10 Details  1 GU:   Prostate Cancer - C61      PLAN:           Schedule Labs: 4 Months - PSA  Return Visit/Planned Activity: 4 Months - Office Visit, Follow up MD          Document Letter(s):  Created for [employer]. Sharyl Nimrod), MD   Created for Patient: Clinical Summary         Notes:    -CT and bone scan results discussed with the patient.  -Cystoscopy today revealed a very small median lobe with no other intravesical or urethral abnormalities.  -He is interested in proceeding with external beam radiation as primary treatment of his prostate cancer.    The risk, benefits and alternatives of gold seed fiducial marker and SpaceOAR placement was discussed with the patient.  Risk include, but are not limited to, bleeding, urinary tract infection, perineal infection, urethral injury, rectal irritation/ulceration, MI, CVA, DVT and the inherent risk of general anesthesia.  He voices understanding and wishes to proceed.

## 2022-10-03 NOTE — Op Note (Signed)
Operative Note   Preoperative diagnosis:  1.  Grade 3 prostate cancer    Postoperative diagnosis: 1.  Same    Procedure(s): 1.  Transrectal ultrasound guided prostatic gold seed fiducial marker and Space OAR placement   Surgeon: Rhoderick Moody, MD   Assistants:  None    Anesthesia:  MAC   Complications:  None   EBL:  <5 mL   Specimens: 1. None   Drains/Catheters: 1.  None   Intraoperative findings:   Fiducial markers were placed at the prostatic base, mid-gland and apex.  SpaceOAR was placed with good separation of the prostate and rectum.    Indication: Lawrence Scott is a 72 y.o. male with grade 3 prostate cancer.  He has elected to proceed with XRT as primary treatment and is here today for gold seed fiducial marker and SpaceOAR placement.  He has been consented for the above procedures, voices understanding and wishes to proceed.    Description of procedure:   After informed consent the patient was brought to the major OR, placed on the table and administered general anesthesia. He was then moved to the modified lithotomy position with his perineum perpendicular to the floor. His perineum and genitalia were then sterilely prepped. An official timeout was then performed.    Real time transrectal ultrasonography was used visualize the prostate.  Gold seed fiducial markers were then placed transperineally at the prostatic base, mid gland and apex.   I then proceeded with placement of SpaceOAR by introducing a needle with the bevel angled inferiorly approximately 2 cm superior to the anus. This was angled downward and under direct ultrasound was placed within the space between the prostatic capsule and rectum. This was confirmed with a small amount of sterile saline injected and this was performed under direct ultrasound. I then attached the SpaceOAR to the needle and injected this in the space between the prostate and rectum with good placement noted.  The patient tolerated the  procedure well and was transferred to the postanesthesia in stable condition.   Plan: Follow-up in 4 months with PSA

## 2022-10-03 NOTE — Transfer of Care (Signed)
Immediate Anesthesia Transfer of Care Note  Patient: Lawrence Scott  Procedure(s) Performed: GOLD SEED IMPLANT (Prostate) SPACE OAR INSTILLATION (Prostate)  Patient Location: PACU  Anesthesia Type:MAC  Level of Consciousness: sedated  Airway & Oxygen Therapy: Patient Spontanous Breathing and Patient connected to face mask oxygen  Post-op Assessment: Report given to RN and Post -op Vital signs reviewed and stable  Post vital signs: Reviewed and stable  Last Vitals:  Vitals Value Taken Time  BP 108/73 10/03/22 1032  Temp    Pulse 69 10/03/22 1034  Resp 15 10/03/22 1034  SpO2 98 % 10/03/22 1034  Vitals shown include unvalidated device data.  Last Pain:  Vitals:   10/03/22 0813  TempSrc: Oral         Complications: No notable events documented.

## 2022-10-03 NOTE — Discharge Instructions (Signed)

## 2022-10-03 NOTE — Anesthesia Preprocedure Evaluation (Addendum)
Anesthesia Evaluation  Patient identified by MRN, date of birth, ID band Patient awake    Reviewed: Allergy & Precautions, NPO status , Patient's Chart, lab work & pertinent test results  Airway Mallampati: II  TM Distance: >3 FB Neck ROM: Limited    Dental  (+) Dental Advisory Given, Chipped, Teeth Intact   Pulmonary neg pulmonary ROS   Pulmonary exam normal breath sounds clear to auscultation       Cardiovascular hypertension, Pt. on medications (-) angina + CAD  (-) Orthopnea and (-) DOE Normal cardiovascular exam Rhythm:Regular Rate:Normal  CT Coronary Ca++ 07/2022 1. Patient's total coronary artery calcium score is 2,396 which is 93rd percentile for patient's of matched age, gender and race/ethnicity. Please note that although the presence of coronary artery calcium documents the presence of coronary artery disease, the severity of this disease and any potential stenosis cannot be assessed on this noncontrast CT examination. Assessment for potential risk factor modification, dietary therapy or pharmacologic therapy may be warranted, if clinically indicated. 2. 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 Recommend annual imaging followup by CTA or MRA.    NM Stress 07/2022   Findings are consistent with ischemia and infarction. The study is low risk.   No ST deviation was noted.   LV perfusion is normal. There is no evidence of ischemia. There is no evidence of infarction.   Left ventricular function is normal. Nuclear stress EF: 61 %. The left ventricular ejection fraction is normal (55-65%). End diastolic cavity size is normal. End systolic cavity size is normal.   Prior study not available for comparison.  Theron Arista [place] comments: "This study demonstrates:  Myoview is normal. No ischemia or infarct. Medication changes / Follow up studies / Other recommendations:   Good news.  Continue to work on risk factor modification. Should be able to proceed with whatever therapy is needed for his prostate"    Neuro/Psych negative neurological ROS     GI/Hepatic Neg liver ROS, hiatal hernia,,,  Endo/Other  negative endocrine ROS    Renal/GU negative Renal ROS     Musculoskeletal  (+) Arthritis ,    Abdominal   Peds  Hematology negative hematology ROS (+)   Anesthesia Other Findings   Reproductive/Obstetrics                             Anesthesia Physical Anesthesia Plan  ASA: 3  Anesthesia Plan: MAC   Post-op Pain Management: Minimal or no pain anticipated   Induction: Intravenous  PONV Risk Score and Plan: 1 and Ondansetron, Propofol infusion, Treatment may vary due to age or medical condition and TIVA  Airway Management Planned: Natural Airway  Additional Equipment:   Intra-op Plan:   Post-operative Plan:   Informed Consent: I have reviewed the patients History and Physical, chart, labs and discussed the procedure including the risks, benefits and alternatives for the proposed anesthesia with the patient or authorized representative who has indicated his/her understanding and acceptance.     Dental advisory given  Plan Discussed with: CRNA  Anesthesia Plan Comments:        Anesthesia Quick Evaluation

## 2022-10-04 ENCOUNTER — Encounter (HOSPITAL_BASED_OUTPATIENT_CLINIC_OR_DEPARTMENT_OTHER): Payer: Self-pay | Admitting: Urology

## 2022-10-04 ENCOUNTER — Telehealth: Payer: Self-pay | Admitting: *Deleted

## 2022-10-04 NOTE — Anesthesia Postprocedure Evaluation (Signed)
Anesthesia Post Note  Patient: Lawrence Scott  Procedure(s) Performed: GOLD SEED IMPLANT (Prostate) SPACE OAR INSTILLATION (Prostate)     Patient location during evaluation: PACU Anesthesia Type: MAC Level of consciousness: awake and alert Pain management: pain level controlled Vital Signs Assessment: post-procedure vital signs reviewed and stable Respiratory status: spontaneous breathing Cardiovascular status: stable Anesthetic complications: no   No notable events documented.  Last Vitals:  Vitals:   10/03/22 1104 10/03/22 1246  BP: 114/86 134/76  Pulse: 64 60  Resp: (!) 21 18  Temp:  (!) 36.3 C  SpO2: 97% 99%    Last Pain:  Vitals:   10/03/22 1246  TempSrc: Oral  PainSc: 0-No pain                 Lewie Loron

## 2022-10-04 NOTE — Telephone Encounter (Signed)
CALLED PATIENT TO REMIND OF SIM APPT. FOR 10-06-22- ARRIVAL TIME- 10:45 AM @ [facility], INFORMED PATIENT TO ARRIVE WITH A FULL BLADDER, SPOKE WITH PATIENT AND HE IS AWARE OF THIS APPT. AND THE INSTRUCTIONS

## 2022-10-06 ENCOUNTER — Other Ambulatory Visit: Payer: Self-pay

## 2022-10-06 ENCOUNTER — Ambulatory Visit
Admission: RE | Admit: 2022-10-06 | Discharge: 2022-10-06 | Disposition: A | Discharge status: Home / Self Care | Payer: Medicare HMO | Source: Ambulatory Visit | Attending: Radiation Oncology | Admitting: Radiation Oncology

## 2022-10-06 DIAGNOSIS — Z510 Encounter for antineoplastic radiation therapy: Secondary | ICD-10-CM | POA: Insufficient documentation

## 2022-10-06 DIAGNOSIS — C61 Malignant neoplasm of prostate: Secondary | ICD-10-CM | POA: Diagnosis not present

## 2022-10-06 DIAGNOSIS — Z191 Hormone sensitive malignancy status: Secondary | ICD-10-CM | POA: Diagnosis not present

## 2022-10-06 NOTE — Progress Notes (Signed)
  Radiation Oncology         [phone] ________________________________  Name: Lawrence Scott MRN: [id number]  Date: 10/06/2022  DOB: 1950-04-12  SIMULATION AND TREATMENT PLANNING NOTE  No diagnosis found.  DIAGNOSIS:   72 y.o. gentleman with Stage T1c adenocarcinoma of the prostate with Gleason score of 4+3, and PSA of 6.82.   NARRATIVE:  The patient was brought to the CT Simulation planning suite.  Identity was confirmed.  All relevant records and images related to the planned course of therapy were reviewed.  The patient freely provided informed written consent to proceed with treatment after reviewing the details related to the planned course of therapy. The consent form was witnessed and verified by the simulation staff.  Then, the patient was set-up in a stable reproducible supine position for radiation therapy.  A vacuum lock pillow device was custom fabricated to position his legs in a reproducible immobilized position.  Then, I performed a urethrogram under sterile conditions to identify the prostatic apex.  CT images were obtained.  Surface markings were placed.  The CT images were loaded into the planning software.  Then the prostate target and avoidance structures including the rectum, bladder, bowel and hips were contoured.  Treatment planning then occurred.  The radiation prescription was entered and confirmed.  A total of one complex treatment devices was fabricated. I have requested : Intensity Modulated Radiotherapy (IMRT) is medically necessary for this case for the following reason:  Rectal sparing.Marland Kitchen  PLAN:  The patient will receive 70 Gy in 28 fractions.  ________________________________  Artist Pais Kathrynn Running, M.D.

## 2022-10-16 DIAGNOSIS — Z191 Hormone sensitive malignancy status: Secondary | ICD-10-CM | POA: Diagnosis not present

## 2022-10-16 DIAGNOSIS — C61 Malignant neoplasm of prostate: Secondary | ICD-10-CM | POA: Diagnosis not present

## 2022-10-16 DIAGNOSIS — Z510 Encounter for antineoplastic radiation therapy: Secondary | ICD-10-CM | POA: Diagnosis not present

## 2022-10-17 ENCOUNTER — Ambulatory Visit
Admission: RE | Admit: 2022-10-17 | Discharge: 2022-10-17 | Disposition: A | Discharge status: Home / Self Care | Payer: Medicare HMO | Source: Ambulatory Visit | Attending: Radiation Oncology | Admitting: Radiation Oncology

## 2022-10-17 ENCOUNTER — Other Ambulatory Visit: Payer: Self-pay

## 2022-10-17 DIAGNOSIS — C61 Malignant neoplasm of prostate: Secondary | ICD-10-CM | POA: Diagnosis not present

## 2022-10-17 DIAGNOSIS — Z191 Hormone sensitive malignancy status: Secondary | ICD-10-CM | POA: Diagnosis not present

## 2022-10-17 DIAGNOSIS — Z510 Encounter for antineoplastic radiation therapy: Secondary | ICD-10-CM | POA: Diagnosis not present

## 2022-10-17 LAB — RAD ONC ARIA SESSION SUMMARY
Course Elapsed Days: 0
Plan Fractions Treated to Date: 1
Plan Prescribed Dose Per Fraction: 2.5 Gy
Plan Total Fractions Prescribed: 28
Plan Total Prescribed Dose: 70 Gy
Reference Point Dosage Given to Date: 2.5 Gy
Reference Point Session Dosage Given: 2.5 Gy
Session Number: 1

## 2022-10-18 ENCOUNTER — Other Ambulatory Visit: Payer: Self-pay

## 2022-10-18 ENCOUNTER — Ambulatory Visit
Admission: RE | Admit: 2022-10-18 | Discharge: 2022-10-18 | Disposition: A | Discharge status: Home / Self Care | Payer: Medicare HMO | Source: Ambulatory Visit | Attending: Radiation Oncology | Admitting: Radiation Oncology

## 2022-10-18 ENCOUNTER — Ambulatory Visit: Payer: Medicare HMO

## 2022-10-18 DIAGNOSIS — Z510 Encounter for antineoplastic radiation therapy: Secondary | ICD-10-CM | POA: Diagnosis not present

## 2022-10-18 DIAGNOSIS — C61 Malignant neoplasm of prostate: Secondary | ICD-10-CM | POA: Diagnosis not present

## 2022-10-18 DIAGNOSIS — Z191 Hormone sensitive malignancy status: Secondary | ICD-10-CM | POA: Diagnosis not present

## 2022-10-18 LAB — RAD ONC ARIA SESSION SUMMARY
Course Elapsed Days: 1
Plan Fractions Treated to Date: 2
Plan Prescribed Dose Per Fraction: 2.5 Gy
Plan Total Fractions Prescribed: 28
Plan Total Prescribed Dose: 70 Gy
Reference Point Dosage Given to Date: 5 Gy
Reference Point Session Dosage Given: 2.5 Gy
Session Number: 2

## 2022-10-19 ENCOUNTER — Other Ambulatory Visit: Payer: Self-pay

## 2022-10-19 ENCOUNTER — Ambulatory Visit
Admission: RE | Admit: 2022-10-19 | Discharge: 2022-10-19 | Disposition: A | Discharge status: Home / Self Care | Payer: Medicare HMO | Source: Ambulatory Visit | Attending: Radiation Oncology | Admitting: Radiation Oncology

## 2022-10-19 DIAGNOSIS — Z510 Encounter for antineoplastic radiation therapy: Secondary | ICD-10-CM | POA: Diagnosis not present

## 2022-10-19 DIAGNOSIS — Z191 Hormone sensitive malignancy status: Secondary | ICD-10-CM | POA: Diagnosis not present

## 2022-10-19 DIAGNOSIS — C61 Malignant neoplasm of prostate: Secondary | ICD-10-CM | POA: Diagnosis not present

## 2022-10-19 LAB — RAD ONC ARIA SESSION SUMMARY
Course Elapsed Days: 2
Plan Fractions Treated to Date: 3
Plan Prescribed Dose Per Fraction: 2.5 Gy
Plan Total Fractions Prescribed: 28
Plan Total Prescribed Dose: 70 Gy
Reference Point Dosage Given to Date: 7.5 Gy
Reference Point Session Dosage Given: 2.5 Gy
Session Number: 3

## 2022-10-20 ENCOUNTER — Ambulatory Visit
Admission: RE | Admit: 2022-10-20 | Discharge: 2022-10-20 | Disposition: A | Discharge status: Home / Self Care | Payer: Medicare HMO | Source: Ambulatory Visit | Attending: Radiation Oncology | Admitting: Radiation Oncology

## 2022-10-20 ENCOUNTER — Other Ambulatory Visit: Payer: Self-pay

## 2022-10-20 DIAGNOSIS — Z510 Encounter for antineoplastic radiation therapy: Secondary | ICD-10-CM | POA: Diagnosis not present

## 2022-10-20 DIAGNOSIS — Z191 Hormone sensitive malignancy status: Secondary | ICD-10-CM | POA: Diagnosis not present

## 2022-10-20 DIAGNOSIS — C61 Malignant neoplasm of prostate: Secondary | ICD-10-CM | POA: Diagnosis not present

## 2022-10-20 LAB — RAD ONC ARIA SESSION SUMMARY
Course Elapsed Days: 3
Plan Fractions Treated to Date: 4
Plan Prescribed Dose Per Fraction: 2.5 Gy
Plan Total Fractions Prescribed: 28
Plan Total Prescribed Dose: 70 Gy
Reference Point Dosage Given to Date: 10 Gy
Reference Point Session Dosage Given: 2.5 Gy
Session Number: 4

## 2022-10-23 ENCOUNTER — Other Ambulatory Visit: Payer: Self-pay

## 2022-10-23 ENCOUNTER — Ambulatory Visit
Admission: RE | Admit: 2022-10-23 | Discharge: 2022-10-23 | Disposition: A | Discharge status: Home / Self Care | Payer: Medicare HMO | Source: Ambulatory Visit | Attending: Radiation Oncology | Admitting: Radiation Oncology

## 2022-10-23 DIAGNOSIS — Z191 Hormone sensitive malignancy status: Secondary | ICD-10-CM | POA: Diagnosis not present

## 2022-10-23 DIAGNOSIS — C61 Malignant neoplasm of prostate: Secondary | ICD-10-CM | POA: Diagnosis not present

## 2022-10-23 DIAGNOSIS — E7800 Pure hypercholesterolemia, unspecified: Secondary | ICD-10-CM

## 2022-10-23 DIAGNOSIS — Z510 Encounter for antineoplastic radiation therapy: Secondary | ICD-10-CM | POA: Diagnosis not present

## 2022-10-23 LAB — RAD ONC ARIA SESSION SUMMARY
Course Elapsed Days: 6
Plan Fractions Treated to Date: 5
Plan Prescribed Dose Per Fraction: 2.5 Gy
Plan Total Fractions Prescribed: 28
Plan Total Prescribed Dose: 70 Gy
Reference Point Dosage Given to Date: 12.5 Gy
Reference Point Session Dosage Given: 2.5 Gy
Session Number: 5

## 2022-10-24 ENCOUNTER — Ambulatory Visit
Admission: RE | Admit: 2022-10-24 | Discharge: 2022-10-24 | Disposition: A | Discharge status: Home / Self Care | Payer: Medicare HMO | Source: Ambulatory Visit | Attending: Radiation Oncology | Admitting: Radiation Oncology

## 2022-10-24 ENCOUNTER — Other Ambulatory Visit: Payer: Self-pay

## 2022-10-24 DIAGNOSIS — Z191 Hormone sensitive malignancy status: Secondary | ICD-10-CM | POA: Diagnosis not present

## 2022-10-24 DIAGNOSIS — Z510 Encounter for antineoplastic radiation therapy: Secondary | ICD-10-CM | POA: Diagnosis not present

## 2022-10-24 DIAGNOSIS — C61 Malignant neoplasm of prostate: Secondary | ICD-10-CM | POA: Diagnosis not present

## 2022-10-24 LAB — RAD ONC ARIA SESSION SUMMARY
Course Elapsed Days: 7
Plan Fractions Treated to Date: 6
Plan Prescribed Dose Per Fraction: 2.5 Gy
Plan Total Fractions Prescribed: 28
Plan Total Prescribed Dose: 70 Gy
Reference Point Dosage Given to Date: 15 Gy
Reference Point Session Dosage Given: 2.5 Gy
Session Number: 6

## 2022-10-25 ENCOUNTER — Encounter: Payer: Self-pay | Admitting: Radiation Oncology

## 2022-10-25 ENCOUNTER — Ambulatory Visit
Admission: RE | Admit: 2022-10-25 | Discharge: 2022-10-25 | Disposition: A | Discharge status: Home / Self Care | Payer: Medicare HMO | Source: Ambulatory Visit | Attending: Radiation Oncology | Admitting: Radiation Oncology

## 2022-10-25 ENCOUNTER — Other Ambulatory Visit: Payer: Self-pay

## 2022-10-25 DIAGNOSIS — Z191 Hormone sensitive malignancy status: Secondary | ICD-10-CM | POA: Diagnosis not present

## 2022-10-25 DIAGNOSIS — Z510 Encounter for antineoplastic radiation therapy: Secondary | ICD-10-CM | POA: Diagnosis not present

## 2022-10-25 DIAGNOSIS — C61 Malignant neoplasm of prostate: Secondary | ICD-10-CM | POA: Diagnosis not present

## 2022-10-25 LAB — RAD ONC ARIA SESSION SUMMARY
Course Elapsed Days: 8
Plan Fractions Treated to Date: 7
Plan Prescribed Dose Per Fraction: 2.5 Gy
Plan Total Fractions Prescribed: 28
Plan Total Prescribed Dose: 70 Gy
Reference Point Dosage Given to Date: 17.5 Gy
Reference Point Session Dosage Given: 2.5 Gy
Session Number: 7

## 2022-10-25 NOTE — Progress Notes (Signed)
If approved he will also get the Prostate grant in addition to the [employer].

## 2022-10-25 NOTE — Progress Notes (Signed)
Spoke w/ pt regarding the [employer].  He would like to apply so he will bring his proof of income on 10/26/22.  If approved I will give him an expense sheet and my card for any questions or concerns he may have in the future.

## 2022-10-26 ENCOUNTER — Ambulatory Visit
Admission: RE | Admit: 2022-10-26 | Discharge: 2022-10-26 | Disposition: A | Discharge status: Home / Self Care | Payer: Medicare HMO | Source: Ambulatory Visit | Attending: Radiation Oncology | Admitting: Radiation Oncology

## 2022-10-26 ENCOUNTER — Other Ambulatory Visit: Payer: Self-pay

## 2022-10-26 DIAGNOSIS — C61 Malignant neoplasm of prostate: Secondary | ICD-10-CM | POA: Diagnosis not present

## 2022-10-26 DIAGNOSIS — Z510 Encounter for antineoplastic radiation therapy: Secondary | ICD-10-CM | POA: Diagnosis not present

## 2022-10-26 DIAGNOSIS — Z191 Hormone sensitive malignancy status: Secondary | ICD-10-CM | POA: Diagnosis not present

## 2022-10-26 LAB — RAD ONC ARIA SESSION SUMMARY
Course Elapsed Days: 9
Plan Fractions Treated to Date: 8
Plan Prescribed Dose Per Fraction: 2.5 Gy
Plan Total Fractions Prescribed: 28
Plan Total Prescribed Dose: 70 Gy
Reference Point Dosage Given to Date: 20 Gy
Reference Point Session Dosage Given: 2.5 Gy
Session Number: 8

## 2022-10-27 ENCOUNTER — Other Ambulatory Visit: Payer: Self-pay

## 2022-10-27 ENCOUNTER — Ambulatory Visit
Admission: RE | Admit: 2022-10-27 | Discharge: 2022-10-27 | Disposition: A | Discharge status: Home / Self Care | Payer: Medicare HMO | Source: Ambulatory Visit | Attending: Radiation Oncology | Admitting: Radiation Oncology

## 2022-10-27 DIAGNOSIS — Z510 Encounter for antineoplastic radiation therapy: Secondary | ICD-10-CM | POA: Diagnosis not present

## 2022-10-27 DIAGNOSIS — C61 Malignant neoplasm of prostate: Secondary | ICD-10-CM | POA: Diagnosis not present

## 2022-10-27 DIAGNOSIS — Z191 Hormone sensitive malignancy status: Secondary | ICD-10-CM | POA: Diagnosis not present

## 2022-10-27 LAB — RAD ONC ARIA SESSION SUMMARY
Course Elapsed Days: 10
Plan Fractions Treated to Date: 9
Plan Prescribed Dose Per Fraction: 2.5 Gy
Plan Total Fractions Prescribed: 28
Plan Total Prescribed Dose: 70 Gy
Reference Point Dosage Given to Date: 22.5 Gy
Reference Point Session Dosage Given: 2.5 Gy
Session Number: 9

## 2022-10-30 ENCOUNTER — Other Ambulatory Visit: Payer: Self-pay

## 2022-10-30 ENCOUNTER — Ambulatory Visit
Admission: RE | Admit: 2022-10-30 | Discharge: 2022-10-30 | Disposition: A | Discharge status: Home / Self Care | Payer: Medicare HMO | Source: Ambulatory Visit | Attending: Radiation Oncology | Admitting: Radiation Oncology

## 2022-10-30 DIAGNOSIS — Z510 Encounter for antineoplastic radiation therapy: Secondary | ICD-10-CM | POA: Diagnosis not present

## 2022-10-30 DIAGNOSIS — Z191 Hormone sensitive malignancy status: Secondary | ICD-10-CM | POA: Diagnosis not present

## 2022-10-30 DIAGNOSIS — C61 Malignant neoplasm of prostate: Secondary | ICD-10-CM | POA: Diagnosis not present

## 2022-10-30 LAB — RAD ONC ARIA SESSION SUMMARY
Course Elapsed Days: 13
Plan Fractions Treated to Date: 10
Plan Prescribed Dose Per Fraction: 2.5 Gy
Plan Total Fractions Prescribed: 28
Plan Total Prescribed Dose: 70 Gy
Reference Point Dosage Given to Date: 25 Gy
Reference Point Session Dosage Given: 2.5 Gy
Session Number: 10

## 2022-10-31 ENCOUNTER — Encounter: Payer: Self-pay | Admitting: Radiation Oncology

## 2022-10-31 ENCOUNTER — Ambulatory Visit
Admission: RE | Admit: 2022-10-31 | Discharge: 2022-10-31 | Disposition: A | Discharge status: Home / Self Care | Payer: Medicare HMO | Source: Ambulatory Visit | Attending: Radiation Oncology | Admitting: Radiation Oncology

## 2022-10-31 ENCOUNTER — Other Ambulatory Visit: Payer: Self-pay

## 2022-10-31 DIAGNOSIS — C61 Malignant neoplasm of prostate: Secondary | ICD-10-CM | POA: Diagnosis not present

## 2022-10-31 DIAGNOSIS — Z191 Hormone sensitive malignancy status: Secondary | ICD-10-CM | POA: Diagnosis not present

## 2022-10-31 DIAGNOSIS — Z510 Encounter for antineoplastic radiation therapy: Secondary | ICD-10-CM | POA: Diagnosis not present

## 2022-10-31 LAB — RAD ONC ARIA SESSION SUMMARY
Course Elapsed Days: 14
Plan Fractions Treated to Date: 11
Plan Prescribed Dose Per Fraction: 2.5 Gy
Plan Total Fractions Prescribed: 28
Plan Total Prescribed Dose: 70 Gy
Reference Point Dosage Given to Date: 27.5 Gy
Reference Point Session Dosage Given: 2.5 Gy
Session Number: 11

## 2022-10-31 NOTE — Progress Notes (Signed)
Pt is approved for the $1000 [employer] grant.  

## 2022-10-31 NOTE — Progress Notes (Signed)
Pt is also approved for the $200 Prostate grant.

## 2022-11-01 ENCOUNTER — Other Ambulatory Visit: Payer: Self-pay

## 2022-11-01 ENCOUNTER — Ambulatory Visit
Admission: RE | Admit: 2022-11-01 | Discharge: 2022-11-01 | Disposition: A | Discharge status: Home / Self Care | Payer: Medicare HMO | Source: Ambulatory Visit | Attending: Radiation Oncology | Admitting: Radiation Oncology

## 2022-11-01 DIAGNOSIS — C61 Malignant neoplasm of prostate: Secondary | ICD-10-CM | POA: Diagnosis not present

## 2022-11-01 DIAGNOSIS — Z510 Encounter for antineoplastic radiation therapy: Secondary | ICD-10-CM | POA: Diagnosis not present

## 2022-11-01 DIAGNOSIS — Z191 Hormone sensitive malignancy status: Secondary | ICD-10-CM | POA: Diagnosis not present

## 2022-11-01 LAB — RAD ONC ARIA SESSION SUMMARY
Course Elapsed Days: 15
Plan Fractions Treated to Date: 12
Plan Prescribed Dose Per Fraction: 2.5 Gy
Plan Total Fractions Prescribed: 28
Plan Total Prescribed Dose: 70 Gy
Reference Point Dosage Given to Date: 30 Gy
Reference Point Session Dosage Given: 2.5 Gy
Session Number: 12

## 2022-11-02 ENCOUNTER — Ambulatory Visit: Payer: Medicare HMO

## 2022-11-03 ENCOUNTER — Other Ambulatory Visit: Payer: Self-pay | Admitting: Radiation Oncology

## 2022-11-03 ENCOUNTER — Ambulatory Visit: Admission: RE | Admit: 2022-11-03 | Payer: Medicare HMO | Source: Ambulatory Visit

## 2022-11-03 ENCOUNTER — Other Ambulatory Visit: Payer: Self-pay

## 2022-11-03 ENCOUNTER — Ambulatory Visit
Admission: RE | Admit: 2022-11-03 | Discharge: 2022-11-03 | Disposition: A | Discharge status: Home / Self Care | Payer: Medicare HMO | Source: Ambulatory Visit | Attending: Radiation Oncology | Admitting: Radiation Oncology

## 2022-11-03 DIAGNOSIS — Z191 Hormone sensitive malignancy status: Secondary | ICD-10-CM | POA: Diagnosis not present

## 2022-11-03 DIAGNOSIS — C61 Malignant neoplasm of prostate: Secondary | ICD-10-CM | POA: Diagnosis not present

## 2022-11-03 DIAGNOSIS — Z510 Encounter for antineoplastic radiation therapy: Secondary | ICD-10-CM | POA: Diagnosis not present

## 2022-11-03 LAB — RAD ONC ARIA SESSION SUMMARY
Course Elapsed Days: 17
Plan Fractions Treated to Date: 13
Plan Prescribed Dose Per Fraction: 2.5 Gy
Plan Total Fractions Prescribed: 28
Plan Total Prescribed Dose: 70 Gy
Reference Point Dosage Given to Date: 32.5 Gy
Reference Point Session Dosage Given: 2.5 Gy
Session Number: 13

## 2022-11-03 MED ORDER — TAMSULOSIN HCL 0.4 MG PO CAPS: 0.4000 mg | ORAL_CAPSULE | Freq: Every day | ORAL | 5 refills | Status: DC

## 2022-11-04 ENCOUNTER — Ambulatory Visit: Payer: Medicare HMO

## 2022-11-06 ENCOUNTER — Ambulatory Visit: Payer: Medicare HMO

## 2022-11-07 ENCOUNTER — Other Ambulatory Visit: Payer: Self-pay

## 2022-11-07 ENCOUNTER — Ambulatory Visit: Admission: RE | Admit: 2022-11-07 | Payer: Medicare HMO | Source: Ambulatory Visit

## 2022-11-07 DIAGNOSIS — Z510 Encounter for antineoplastic radiation therapy: Secondary | ICD-10-CM | POA: Diagnosis not present

## 2022-11-07 DIAGNOSIS — C61 Malignant neoplasm of prostate: Secondary | ICD-10-CM | POA: Diagnosis not present

## 2022-11-07 DIAGNOSIS — Z191 Hormone sensitive malignancy status: Secondary | ICD-10-CM | POA: Diagnosis not present

## 2022-11-07 LAB — RAD ONC ARIA SESSION SUMMARY
Course Elapsed Days: 21
Plan Fractions Treated to Date: 14
Plan Prescribed Dose Per Fraction: 2.5 Gy
Plan Total Fractions Prescribed: 28
Plan Total Prescribed Dose: 70 Gy
Reference Point Dosage Given to Date: 35 Gy
Reference Point Session Dosage Given: 2.5 Gy
Session Number: 14

## 2022-11-08 ENCOUNTER — Ambulatory Visit
Admission: RE | Admit: 2022-11-08 | Discharge: 2022-11-08 | Disposition: A | Discharge status: Home / Self Care | Payer: Medicare HMO | Source: Ambulatory Visit | Attending: Radiation Oncology | Admitting: Radiation Oncology

## 2022-11-08 ENCOUNTER — Other Ambulatory Visit: Payer: Self-pay

## 2022-11-08 DIAGNOSIS — Z510 Encounter for antineoplastic radiation therapy: Secondary | ICD-10-CM | POA: Diagnosis not present

## 2022-11-08 DIAGNOSIS — C61 Malignant neoplasm of prostate: Secondary | ICD-10-CM | POA: Diagnosis not present

## 2022-11-08 DIAGNOSIS — Z191 Hormone sensitive malignancy status: Secondary | ICD-10-CM | POA: Diagnosis not present

## 2022-11-08 LAB — RAD ONC ARIA SESSION SUMMARY
Course Elapsed Days: 22
Plan Fractions Treated to Date: 15
Plan Prescribed Dose Per Fraction: 2.5 Gy
Plan Total Fractions Prescribed: 28
Plan Total Prescribed Dose: 70 Gy
Reference Point Dosage Given to Date: 37.5 Gy
Reference Point Session Dosage Given: 2.5 Gy
Session Number: 15

## 2022-11-09 ENCOUNTER — Ambulatory Visit: Payer: Medicare HMO

## 2022-11-10 ENCOUNTER — Ambulatory Visit
Admission: RE | Admit: 2022-11-10 | Discharge: 2022-11-10 | Disposition: A | Discharge status: Home / Self Care | Payer: Medicare HMO | Source: Ambulatory Visit | Attending: Radiation Oncology | Admitting: Radiation Oncology

## 2022-11-10 ENCOUNTER — Ambulatory Visit: Admission: RE | Admit: 2022-11-10 | Payer: Medicare HMO | Source: Ambulatory Visit

## 2022-11-10 ENCOUNTER — Other Ambulatory Visit: Payer: Self-pay

## 2022-11-10 DIAGNOSIS — Z510 Encounter for antineoplastic radiation therapy: Secondary | ICD-10-CM | POA: Diagnosis not present

## 2022-11-10 DIAGNOSIS — C61 Malignant neoplasm of prostate: Secondary | ICD-10-CM | POA: Diagnosis not present

## 2022-11-10 DIAGNOSIS — Z191 Hormone sensitive malignancy status: Secondary | ICD-10-CM | POA: Diagnosis not present

## 2022-11-10 LAB — RAD ONC ARIA SESSION SUMMARY
Course Elapsed Days: 24
Plan Fractions Treated to Date: 16
Plan Prescribed Dose Per Fraction: 2.5 Gy
Plan Total Fractions Prescribed: 28
Plan Total Prescribed Dose: 70 Gy
Reference Point Dosage Given to Date: 40 Gy
Reference Point Session Dosage Given: 2.5 Gy
Session Number: 16

## 2022-11-13 ENCOUNTER — Ambulatory Visit
Admission: RE | Admit: 2022-11-13 | Discharge: 2022-11-13 | Disposition: A | Discharge status: Home / Self Care | Payer: Medicare HMO | Source: Ambulatory Visit | Attending: Radiation Oncology | Admitting: Radiation Oncology

## 2022-11-13 ENCOUNTER — Other Ambulatory Visit: Payer: Self-pay

## 2022-11-13 DIAGNOSIS — Z191 Hormone sensitive malignancy status: Secondary | ICD-10-CM | POA: Diagnosis not present

## 2022-11-13 DIAGNOSIS — Z510 Encounter for antineoplastic radiation therapy: Secondary | ICD-10-CM | POA: Diagnosis not present

## 2022-11-13 DIAGNOSIS — C61 Malignant neoplasm of prostate: Secondary | ICD-10-CM | POA: Diagnosis not present

## 2022-11-13 LAB — RAD ONC ARIA SESSION SUMMARY
Course Elapsed Days: 27
Plan Fractions Treated to Date: 17
Plan Prescribed Dose Per Fraction: 2.5 Gy
Plan Total Fractions Prescribed: 28
Plan Total Prescribed Dose: 70 Gy
Reference Point Dosage Given to Date: 42.5 Gy
Reference Point Session Dosage Given: 2.5 Gy
Session Number: 17

## 2022-11-14 ENCOUNTER — Ambulatory Visit: Admission: RE | Admit: 2022-11-14 | Payer: Medicare HMO | Source: Ambulatory Visit

## 2022-11-14 ENCOUNTER — Other Ambulatory Visit: Payer: Self-pay

## 2022-11-14 DIAGNOSIS — Z191 Hormone sensitive malignancy status: Secondary | ICD-10-CM | POA: Diagnosis not present

## 2022-11-14 DIAGNOSIS — C61 Malignant neoplasm of prostate: Secondary | ICD-10-CM | POA: Diagnosis not present

## 2022-11-14 DIAGNOSIS — Z510 Encounter for antineoplastic radiation therapy: Secondary | ICD-10-CM | POA: Diagnosis not present

## 2022-11-14 LAB — RAD ONC ARIA SESSION SUMMARY
Course Elapsed Days: 28
Plan Fractions Treated to Date: 18
Plan Prescribed Dose Per Fraction: 2.5 Gy
Plan Total Fractions Prescribed: 28
Plan Total Prescribed Dose: 70 Gy
Reference Point Dosage Given to Date: 45 Gy
Reference Point Session Dosage Given: 2.5 Gy
Session Number: 18

## 2022-11-15 ENCOUNTER — Other Ambulatory Visit: Payer: Self-pay

## 2022-11-15 ENCOUNTER — Ambulatory Visit
Admission: RE | Admit: 2022-11-15 | Discharge: 2022-11-15 | Disposition: A | Discharge status: Home / Self Care | Payer: Medicare HMO | Source: Ambulatory Visit | Attending: Radiation Oncology | Admitting: Radiation Oncology

## 2022-11-15 DIAGNOSIS — C61 Malignant neoplasm of prostate: Secondary | ICD-10-CM | POA: Diagnosis not present

## 2022-11-15 DIAGNOSIS — Z510 Encounter for antineoplastic radiation therapy: Secondary | ICD-10-CM | POA: Diagnosis not present

## 2022-11-15 DIAGNOSIS — Z191 Hormone sensitive malignancy status: Secondary | ICD-10-CM | POA: Diagnosis not present

## 2022-11-15 LAB — RAD ONC ARIA SESSION SUMMARY
Course Elapsed Days: 29
Plan Fractions Treated to Date: 19
Plan Prescribed Dose Per Fraction: 2.5 Gy
Plan Total Fractions Prescribed: 28
Plan Total Prescribed Dose: 70 Gy
Reference Point Dosage Given to Date: 47.5 Gy
Reference Point Session Dosage Given: 2.5 Gy
Session Number: 19

## 2022-11-16 ENCOUNTER — Other Ambulatory Visit: Payer: Self-pay

## 2022-11-16 ENCOUNTER — Ambulatory Visit
Admission: RE | Admit: 2022-11-16 | Discharge: 2022-11-16 | Disposition: A | Discharge status: Home / Self Care | Payer: Medicare HMO | Source: Ambulatory Visit | Attending: Radiation Oncology | Admitting: Radiation Oncology

## 2022-11-16 DIAGNOSIS — Z191 Hormone sensitive malignancy status: Secondary | ICD-10-CM | POA: Diagnosis not present

## 2022-11-16 DIAGNOSIS — Z510 Encounter for antineoplastic radiation therapy: Secondary | ICD-10-CM | POA: Diagnosis not present

## 2022-11-16 DIAGNOSIS — C61 Malignant neoplasm of prostate: Secondary | ICD-10-CM | POA: Diagnosis not present

## 2022-11-16 LAB — RAD ONC ARIA SESSION SUMMARY
Course Elapsed Days: 30
Plan Fractions Treated to Date: 20
Plan Prescribed Dose Per Fraction: 2.5 Gy
Plan Total Fractions Prescribed: 28
Plan Total Prescribed Dose: 70 Gy
Reference Point Dosage Given to Date: 50 Gy
Reference Point Session Dosage Given: 2.5 Gy
Session Number: 20

## 2022-11-17 ENCOUNTER — Other Ambulatory Visit: Payer: Self-pay

## 2022-11-17 ENCOUNTER — Ambulatory Visit
Admission: RE | Admit: 2022-11-17 | Discharge: 2022-11-17 | Disposition: A | Discharge status: Home / Self Care | Payer: Medicare HMO | Source: Ambulatory Visit | Attending: Radiation Oncology | Admitting: Radiation Oncology

## 2022-11-17 DIAGNOSIS — Z191 Hormone sensitive malignancy status: Secondary | ICD-10-CM | POA: Diagnosis not present

## 2022-11-17 DIAGNOSIS — C61 Malignant neoplasm of prostate: Secondary | ICD-10-CM | POA: Diagnosis not present

## 2022-11-17 DIAGNOSIS — Z510 Encounter for antineoplastic radiation therapy: Secondary | ICD-10-CM | POA: Diagnosis not present

## 2022-11-17 LAB — RAD ONC ARIA SESSION SUMMARY
Course Elapsed Days: 31
Plan Fractions Treated to Date: 21
Plan Prescribed Dose Per Fraction: 2.5 Gy
Plan Total Fractions Prescribed: 28
Plan Total Prescribed Dose: 70 Gy
Reference Point Dosage Given to Date: 52.5 Gy
Reference Point Session Dosage Given: 2.5 Gy
Session Number: 21

## 2022-11-20 ENCOUNTER — Other Ambulatory Visit: Payer: Self-pay

## 2022-11-20 ENCOUNTER — Ambulatory Visit
Admission: RE | Admit: 2022-11-20 | Discharge: 2022-11-20 | Disposition: A | Discharge status: Home / Self Care | Payer: Medicare HMO | Source: Ambulatory Visit | Attending: Radiation Oncology | Admitting: Radiation Oncology

## 2022-11-20 DIAGNOSIS — C61 Malignant neoplasm of prostate: Secondary | ICD-10-CM | POA: Diagnosis not present

## 2022-11-20 DIAGNOSIS — Z191 Hormone sensitive malignancy status: Secondary | ICD-10-CM | POA: Diagnosis not present

## 2022-11-20 DIAGNOSIS — Z510 Encounter for antineoplastic radiation therapy: Secondary | ICD-10-CM | POA: Diagnosis not present

## 2022-11-20 LAB — RAD ONC ARIA SESSION SUMMARY
Course Elapsed Days: 34
Plan Fractions Treated to Date: 22
Plan Prescribed Dose Per Fraction: 2.5 Gy
Plan Total Fractions Prescribed: 28
Plan Total Prescribed Dose: 70 Gy
Reference Point Dosage Given to Date: 55 Gy
Reference Point Session Dosage Given: 2.5 Gy
Session Number: 22

## 2022-11-21 ENCOUNTER — Other Ambulatory Visit: Payer: Self-pay

## 2022-11-21 ENCOUNTER — Ambulatory Visit: Admission: RE | Admit: 2022-11-21 | Payer: Medicare HMO | Source: Ambulatory Visit

## 2022-11-21 DIAGNOSIS — Z510 Encounter for antineoplastic radiation therapy: Secondary | ICD-10-CM | POA: Diagnosis not present

## 2022-11-21 DIAGNOSIS — Z191 Hormone sensitive malignancy status: Secondary | ICD-10-CM | POA: Diagnosis not present

## 2022-11-21 DIAGNOSIS — C61 Malignant neoplasm of prostate: Secondary | ICD-10-CM | POA: Diagnosis not present

## 2022-11-21 LAB — RAD ONC ARIA SESSION SUMMARY
Course Elapsed Days: 35
Plan Fractions Treated to Date: 23
Plan Prescribed Dose Per Fraction: 2.5 Gy
Plan Total Fractions Prescribed: 28
Plan Total Prescribed Dose: 70 Gy
Reference Point Dosage Given to Date: 57.5 Gy
Reference Point Session Dosage Given: 2.5 Gy
Session Number: 23

## 2022-11-22 ENCOUNTER — Ambulatory Visit
Admission: RE | Admit: 2022-11-22 | Discharge: 2022-11-22 | Disposition: A | Discharge status: Home / Self Care | Payer: Medicare HMO | Source: Ambulatory Visit | Attending: Radiation Oncology | Admitting: Radiation Oncology

## 2022-11-22 ENCOUNTER — Other Ambulatory Visit: Payer: Self-pay

## 2022-11-22 DIAGNOSIS — C61 Malignant neoplasm of prostate: Secondary | ICD-10-CM | POA: Diagnosis not present

## 2022-11-22 DIAGNOSIS — Z510 Encounter for antineoplastic radiation therapy: Secondary | ICD-10-CM | POA: Diagnosis not present

## 2022-11-22 DIAGNOSIS — Z191 Hormone sensitive malignancy status: Secondary | ICD-10-CM | POA: Diagnosis not present

## 2022-11-22 LAB — RAD ONC ARIA SESSION SUMMARY
Course Elapsed Days: 36
Plan Fractions Treated to Date: 24
Plan Prescribed Dose Per Fraction: 2.5 Gy
Plan Total Fractions Prescribed: 28
Plan Total Prescribed Dose: 70 Gy
Reference Point Dosage Given to Date: 60 Gy
Reference Point Session Dosage Given: 2.5 Gy
Session Number: 24

## 2022-11-23 ENCOUNTER — Ambulatory Visit: Payer: Medicare HMO

## 2022-11-23 ENCOUNTER — Other Ambulatory Visit: Payer: Self-pay

## 2022-11-23 ENCOUNTER — Ambulatory Visit
Admission: RE | Admit: 2022-11-23 | Discharge: 2022-11-23 | Disposition: A | Discharge status: Home / Self Care | Payer: Medicare HMO | Source: Ambulatory Visit | Attending: Radiation Oncology | Admitting: Radiation Oncology

## 2022-11-23 DIAGNOSIS — Z510 Encounter for antineoplastic radiation therapy: Secondary | ICD-10-CM | POA: Diagnosis not present

## 2022-11-23 DIAGNOSIS — C61 Malignant neoplasm of prostate: Secondary | ICD-10-CM | POA: Diagnosis not present

## 2022-11-23 DIAGNOSIS — Z191 Hormone sensitive malignancy status: Secondary | ICD-10-CM | POA: Diagnosis not present

## 2022-11-23 LAB — RAD ONC ARIA SESSION SUMMARY
Course Elapsed Days: 37
Plan Fractions Treated to Date: 25
Plan Prescribed Dose Per Fraction: 2.5 Gy
Plan Total Fractions Prescribed: 28
Plan Total Prescribed Dose: 70 Gy
Reference Point Dosage Given to Date: 62.5 Gy
Reference Point Session Dosage Given: 2.5 Gy
Session Number: 25

## 2022-11-23 NOTE — Progress Notes (Signed)
RN received call from patient to inquire if there are any additional resources for financial help while he is still under treatment.    Patient previously approved with [employer]ela [employer].  Will place referral for social work to review any community resources.  LM with patient to update him of plan.

## 2022-11-24 ENCOUNTER — Ambulatory Visit: Payer: Medicare HMO

## 2022-11-24 ENCOUNTER — Ambulatory Visit
Admission: RE | Admit: 2022-11-24 | Discharge: 2022-11-24 | Disposition: A | Discharge status: Home / Self Care | Payer: Medicare HMO | Source: Ambulatory Visit | Attending: Radiation Oncology | Admitting: Radiation Oncology

## 2022-11-24 ENCOUNTER — Ambulatory Visit: Admission: RE | Admit: 2022-11-24 | Payer: Medicare HMO | Source: Ambulatory Visit

## 2022-11-24 ENCOUNTER — Inpatient Hospital Stay: Payer: Medicare HMO | Attending: Radiation Oncology | Admitting: Licensed Clinical Social Worker

## 2022-11-24 ENCOUNTER — Other Ambulatory Visit: Payer: Self-pay

## 2022-11-24 DIAGNOSIS — C61 Malignant neoplasm of prostate: Secondary | ICD-10-CM | POA: Diagnosis not present

## 2022-11-24 DIAGNOSIS — Z191 Hormone sensitive malignancy status: Secondary | ICD-10-CM | POA: Diagnosis not present

## 2022-11-24 DIAGNOSIS — Z510 Encounter for antineoplastic radiation therapy: Secondary | ICD-10-CM | POA: Diagnosis not present

## 2022-11-24 LAB — RAD ONC ARIA SESSION SUMMARY
Course Elapsed Days: 38
Plan Fractions Treated to Date: 26
Plan Prescribed Dose Per Fraction: 2.5 Gy
Plan Total Fractions Prescribed: 28
Plan Total Prescribed Dose: 70 Gy
Reference Point Dosage Given to Date: 65 Gy
Reference Point Session Dosage Given: 2.5 Gy
Session Number: 26

## 2022-11-24 NOTE — Progress Notes (Signed)
[facility] CSW Progress Note  Clinical Child psychotherapist contacted patient by phone to discuss financial concerns.  Pt has been approved for the [employer] and Prostate Kennedy Bucker, but has continued concerns regarding his co-pays.  CSW provided pt w/ information on [employer] to apply for their co-pay assistance.  Pt will need to register online.  Pt does not have a computer or email.  CSW suggested pt ask one of his daughters to assist with the online application as he will need to apply online to be considered.  Pt verbalized agreement.  CSW to remain available as appropriate to provide support throughout duration of treatment.      Rachel Moulds, LCSW Clinical Social Worker [facility]

## 2022-11-27 ENCOUNTER — Ambulatory Visit: Payer: Medicare HMO

## 2022-11-28 ENCOUNTER — Ambulatory Visit: Payer: Medicare HMO

## 2022-11-28 ENCOUNTER — Other Ambulatory Visit: Payer: Self-pay

## 2022-11-28 ENCOUNTER — Ambulatory Visit
Admission: RE | Admit: 2022-11-28 | Discharge: 2022-11-28 | Disposition: A | Discharge status: Home / Self Care | Payer: Medicare HMO | Source: Ambulatory Visit | Attending: Radiation Oncology | Admitting: Radiation Oncology

## 2022-11-28 DIAGNOSIS — C61 Malignant neoplasm of prostate: Secondary | ICD-10-CM | POA: Insufficient documentation

## 2022-11-28 DIAGNOSIS — Z191 Hormone sensitive malignancy status: Secondary | ICD-10-CM | POA: Diagnosis not present

## 2022-11-28 DIAGNOSIS — Z510 Encounter for antineoplastic radiation therapy: Secondary | ICD-10-CM | POA: Insufficient documentation

## 2022-11-28 LAB — RAD ONC ARIA SESSION SUMMARY
Course Elapsed Days: 42
Plan Fractions Treated to Date: 27
Plan Prescribed Dose Per Fraction: 2.5 Gy
Plan Total Fractions Prescribed: 28
Plan Total Prescribed Dose: 70 Gy
Reference Point Dosage Given to Date: 67.5 Gy
Reference Point Session Dosage Given: 2.5 Gy
Session Number: 27

## 2022-11-29 ENCOUNTER — Ambulatory Visit
Admission: RE | Admit: 2022-11-29 | Discharge: 2022-11-29 | Disposition: A | Discharge status: Home / Self Care | Payer: Medicare HMO | Source: Ambulatory Visit | Attending: Radiation Oncology | Admitting: Radiation Oncology

## 2022-11-29 ENCOUNTER — Other Ambulatory Visit: Payer: Self-pay

## 2022-11-29 DIAGNOSIS — C61 Malignant neoplasm of prostate: Secondary | ICD-10-CM | POA: Diagnosis not present

## 2022-11-29 DIAGNOSIS — Z510 Encounter for antineoplastic radiation therapy: Secondary | ICD-10-CM | POA: Diagnosis not present

## 2022-11-29 DIAGNOSIS — Z191 Hormone sensitive malignancy status: Secondary | ICD-10-CM | POA: Diagnosis not present

## 2022-11-29 LAB — RAD ONC ARIA SESSION SUMMARY
Course Elapsed Days: 43
Plan Fractions Treated to Date: 28
Plan Prescribed Dose Per Fraction: 2.5 Gy
Plan Total Fractions Prescribed: 28
Plan Total Prescribed Dose: 70 Gy
Reference Point Dosage Given to Date: 70 Gy
Reference Point Session Dosage Given: 2.5 Gy
Session Number: 28

## 2022-11-30 NOTE — Radiation Completion Notes (Addendum)
Radiation Oncology         [phone]) [phone] ________________________________  Name: Lawrence Scott MRN: [id number]  Date: 11/29/2022  DOB: December 13, 1950  Patient Name: Lawrence Scott, Lawrence Scott MRN: [id number] Date of Birth: 1950/09/01 Referring Physician: Rhoderick Moody, M.D. Date of Service: 2022-11-30 Radiation Oncologist: Margaretmary Bayley, M.D. [facility]     RADIATION ONCOLOGY END OF TREATMENT NOTE     Diagnosis: 72 y.o. gentleman with Stage T1c adenocarcinoma of the prostate with Gleason score of 4+3, and PSA of 6.82.   Intent: Curative     ==========DELIVERED PLANS==========  First Treatment Date: 2022-10-17 - Last Treatment Date: 2022-11-29   Plan Name: Prostate Site: Prostate Technique: IMRT Mode: Photon Dose Per Fraction: 2.5 Gy Prescribed Dose (Delivered / Prescribed): 70 Gy / 70 Gy Prescribed Fxs (Delivered / Prescribed): 28 / 28     ==========ON TREATMENT VISIT DATES========== 2022-10-20, 2022-10-27, 2022-11-03, 2022-11-10, 2022-11-17, 2022-11-24   See weekly On Treatment Notes in Epic for details.  He tolerated the radiation treatments relatively well with mild urinary symptoms and modest fatigue.  He reported increased nocturia which was improved with Flomax daily.  The patient will receive a call in about one month from the radiation oncology department. He will continue follow up with his urologist, Dr. Liliane Shi, as well.  ------------------------------------------------   Margaretmary Dys, MD [facility]  Radiation Oncology Direct Dial: [phone]  Fax: [phone] [email or web address]  Skype  LinkedIn

## 2022-12-06 DIAGNOSIS — H903 Sensorineural hearing loss, bilateral: Secondary | ICD-10-CM | POA: Diagnosis not present

## 2022-12-22 DIAGNOSIS — C61 Malignant neoplasm of prostate: Secondary | ICD-10-CM

## 2022-12-22 NOTE — Progress Notes (Signed)
Patient was a RadOnc Consult on 08/14/22 for his Stage T1c adenocarcinoma of the prostate with Gleason score of 4+3, and PSA of 6.82.  Patient proceed with treatment recommendations of 5.5 weeks of external radiation and had his final radiation treatment on 11/29/22.   Patient is scheduled for a post treatment nurse call on 01/23/23 and has his first post treatment PSA on 01/05/23 at [facility].

## 2023-01-05 ENCOUNTER — Other Ambulatory Visit: Payer: Self-pay | Admitting: Urology

## 2023-01-05 DIAGNOSIS — C61 Malignant neoplasm of prostate: Secondary | ICD-10-CM

## 2023-01-08 ENCOUNTER — Encounter: Payer: Self-pay | Admitting: *Deleted

## 2023-01-10 DIAGNOSIS — H524 Presbyopia: Secondary | ICD-10-CM | POA: Diagnosis not present

## 2023-01-10 DIAGNOSIS — H02105 Unspecified ectropion of left lower eyelid: Secondary | ICD-10-CM | POA: Diagnosis not present

## 2023-01-10 DIAGNOSIS — H52202 Unspecified astigmatism, left eye: Secondary | ICD-10-CM | POA: Diagnosis not present

## 2023-01-10 DIAGNOSIS — H02102 Unspecified ectropion of right lower eyelid: Secondary | ICD-10-CM | POA: Diagnosis not present

## 2023-01-10 DIAGNOSIS — Z961 Presence of intraocular lens: Secondary | ICD-10-CM | POA: Diagnosis not present

## 2023-01-23 ENCOUNTER — Ambulatory Visit
Admission: RE | Admit: 2023-01-23 | Discharge: 2023-01-23 | Disposition: A | Discharge status: Home / Self Care | Payer: Medicare HMO | Source: Ambulatory Visit | Attending: Radiation Oncology | Admitting: Radiation Oncology

## 2023-01-23 NOTE — Progress Notes (Signed)
Radiation Oncology         [phone] ________________________________  Name: Lawrence Scott MRN: [id number]  Date of Service: 01/23/2023  DOB: 1950-05-02  Post Treatment Telephone Note  Diagnosis:  Stage T1c adenocarcinoma of the prostate with Gleason score of 4+3, and PSA of 6.82. (as documented in provider EOT note)  Pre Treatment IPSS Score: 10 (as documented in the provider consult note)  The patient was not available for call today. Voicemail left.  Patient has a scheduled follow up visit with his urologist, Dr. Liliane Shi, on 03/2023 for ongoing surveillance. He was counseled that PSA levels will be drawn in the urology office, and was reassured that additional time is expected to improve bowel and bladder symptoms. He was encouraged to call back with concerns or questions regarding radiation.   Ruel Favors, LPN

## 2023-02-08 ENCOUNTER — Encounter: Payer: Self-pay | Admitting: *Deleted

## 2023-02-08 ENCOUNTER — Inpatient Hospital Stay: Payer: Medicare HMO | Attending: Radiation Oncology | Admitting: *Deleted

## 2023-02-08 DIAGNOSIS — C61 Malignant neoplasm of prostate: Secondary | ICD-10-CM

## 2023-02-08 NOTE — Progress Notes (Signed)
Called [facility] to obtain last date of colonoscopy. Awaiting to get info.

## 2023-02-08 NOTE — Progress Notes (Signed)
SCP reviewed and completed. Most recent PSA lab results was 1.17 on Oct.11, 2024 at [facility]. Pt will have repeat labs in 6 mos.

## 2023-02-13 ENCOUNTER — Encounter: Payer: Self-pay | Admitting: *Deleted

## 2023-02-19 ENCOUNTER — Encounter: Payer: Self-pay | Admitting: *Deleted

## 2023-02-19 NOTE — Progress Notes (Signed)
Updated pt's colonoscopy and immunization information.

## 2023-02-20 ENCOUNTER — Encounter: Payer: Self-pay | Admitting: Gastroenterology

## 2023-02-26 DIAGNOSIS — Z0100 Encounter for examination of eyes and vision without abnormal findings: Secondary | ICD-10-CM | POA: Diagnosis not present

## 2023-04-04 NOTE — Progress Notes (Signed)
 Cardiology Office Note:    Date:  04/10/2023   ID:  Caldwell, Shellhammer 1950/12/11, MRN [id number]  PCP:  Frederik Charleston, MD   [facility] Providers Cardiologist:  Laure Leone, MD     Referring MD: Frederik Charleston, MD   Chief Complaint  Patient presents with   Coronary Artery Disease    History of Present Illness:    Lawrence Scott is a 73 y.o. male is seen  for follow up  of atherosclerosis/CAD. He has a history of HTN and HLD. He has chronic venous insufficiency followed by Dr Eliza. He had screening coronary calcium  score which was  abnormal with score of 2396. He also had prior CT of the chest in 2012 and 2021 showing significant coronary calcification.   In May 2024 he had a Myoview  study done which was normal. Recommended continued risk factor modification.   He denies any chest pain or SOB. No edema. No palpitations. Has gained weight  Past Medical History:  Diagnosis Date   Allergic rhinitis    Arthritis    Atherosclerosis of coronary artery 07/2022   cardiologist-- dr Maysen Sudol;   cardiac CT 08-11-2022  calcium  score=2396, nonobstructive and ascending thoracic 4.1cm;  nuclear stress test 08-23-2022  normal perfersion, low risk, nuclear ef 55-60%   B12 deficiency    Chronic venous insufficiency of lower extremity    vascular-- dr eliza;  varicose vein   Environmental allergies    Gout    followed by pcp; (09-25-2022  per pt mostly right foot,  last flare-up many yrs)   Hiatal hernia    Hyperlipidemia    Hypertension    Idiopathic peripheral neuropathy    Malignant neoplasm prostate [facility]) 06/2022   urologist-- dr winter/  radiation onologist--- dr patrcia;  dx 04/ 2024, Gleason 4+3   Wears hearing aid in both ears     Past Surgical History:  Procedure Laterality Date   CATARACT EXTRACTION W/ INTRAOCULAR LENS IMPLANT Bilateral 08/2021   COLONOSCOPY  2019   [facility]   ESOPHAGOGASTRODUODENOSCOPY  2022   [facility]   GOLD SEED IMPLANT N/A 10/03/2022    Procedure: GOLD SEED IMPLANT;  Surgeon: Devere Lonni Righter, MD;  Location: [facility];  Service: Urology;  Laterality: N/A;   SPACE OAR INSTILLATION N/A 10/03/2022   Procedure: SPACE OAR INSTILLATION;  Surgeon: Devere Lonni Righter, MD;  Location: [facility];  Service: Urology;  Laterality: N/A;    Current Medications: Current Meds  Medication Sig   allopurinol (ZYLOPRIM) 100 MG tablet Take 100 mg by mouth daily.   aspirin EC 81 MG tablet Take 81 mg by mouth daily. Swallow whole.   cyanocobalamin  (VITAMIN B12) 1000 MCG/ML injection Inject 1,000 mcg into the skin every 30 (thirty) days. Per pt first Saturday of each month   fluticasone (FLONASE) 50 MCG/ACT nasal spray Place 1 spray into both nostrils daily as needed for allergies or rhinitis.   folic acid  (FOLVITE ) 800 MCG tablet Take 800 mcg by mouth daily.   lisinopril-hydrochlorothiazide (ZESTORETIC) 20-25 MG tablet Take 1 tablet by mouth daily.   multivitamin (ONE-A-DAY MEN'S) TABS tablet Take 1 tablet by mouth daily.   tamsulosin  (FLOMAX ) 0.4 MG CAPS capsule Take 1 capsule (0.4 mg total) by mouth daily after supper.     Allergies:   Indomethacin   Social History   Socioeconomic History   Marital status: Single    Spouse name: Not on file   Number of children: 2  Years of education: Not on file   Highest education level: Not on file  Occupational History   Not on file  Tobacco Use   Smoking status: Never   Smokeless tobacco: Never  Vaping Use   Vaping status: Never Used  Substance and Sexual Activity   Alcohol use: Yes    Comment: 09-25-2022  per pt one bottle wine 3-4 days   Drug use: Never   Sexual activity: Not Currently  Other Topics Concern   Not on file  Social History Narrative   Retired games developer   Social Drivers of Corporate Investment Banker Strain: Not on file  Food Insecurity: No Food Insecurity (08/14/2022)   Hunger Vital Sign    Worried About  Running Out of Food in the Last Year: Never true    Ran Out of Food in the Last Year: Never true  Transportation Needs: No Transportation Needs (08/14/2022)   PRAPARE - Administrator, Civil Service (Medical): No    Lack of Transportation (Non-Medical): No  Physical Activity: Not on file  Stress: Not on file  Social Connections: Not on file     Family History: The patient's family history includes Brain cancer in his brother; Lung cancer in his brother, mother, and another family member.  ROS:   Please see the history of present illness.     All other systems reviewed and are negative.  EKGs/Labs/Other Studies Reviewed:    The following studies were reviewed today: CLINICAL DATA:  73 year old Caucasian male with history of hypertension.   * Tracking Code: FCC *   EXAM: CT CARDIAC CORONARY ARTERY CALCIUM  SCORE   TECHNIQUE: Non-contrast imaging through the heart was performed using prospective ECG gating. Image post processing was performed on an independent workstation, allowing for quantitative analysis of the heart and coronary arteries. Note that this exam targets the heart and the chest was not imaged in its entirety.   COMPARISON:  Chest CTA 06/02/2019.   FINDINGS: CORONARY CALCIUM  SCORES:   Left Main: 3   LAD: 550   LCx: 104   RCA: 1,739   Total Agatston Score: 2,396   MESA database percentile: 93rd   AORTA MEASUREMENTS:   Ascending Aorta: 4.1 cm   Descending Aorta:2.7 cm   OTHER FINDINGS:   Atherosclerotic calcifications in the thoracic aorta. Within the visualized portions of the thorax there are no suspicious appearing pulmonary nodules or masses, there is no acute consolidative airspace disease, no pleural effusions, no pneumothorax and no lymphadenopathy. Visualized portions of the upper abdomen are unremarkable. There are no aggressive appearing lytic or blastic lesions noted in the visualized portions of the skeleton.    IMPRESSION: 1. Patient's total coronary artery calcium  score is 2,396 which is 93rd percentile for patient's of matched age, gender and race/ethnicity. Please note that although the presence of coronary artery calcium  documents the presence of coronary artery disease, the severity of this disease and any potential stenosis cannot be assessed on this noncontrast CT examination. Assessment for potential risk factor modification, dietary therapy or pharmacologic therapy may be warranted, if clinically indicated. 2. 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 Recommend annual imaging followup by CTA or MRA. This recommendation follows 2010 ACCF/AHA/AATS/ACR/ASA/SCA/SCAI/SIR/STS/SVM Guidelines for the Diagnosis and Management of Patients with Thoracic Aortic Disease. Circulation. 2010; 121: Z733-z630. Aortic aneurysm NOS (ICD10-I71.9).  Myoview  08/23/22: Study Highlights      Findings are consistent with ischemia and infarction. The study is low  risk.   No ST deviation was noted.   LV perfusion is normal. There is no evidence of ischemia. There is no evidence of infarction.   Left ventricular function is normal. Nuclear stress EF: 61 %. The left ventricular ejection fraction is normal (55-65%). End diastolic cavity size is normal. End systolic cavity size is normal.   Prior study not available for comparison.  EKG:  EKG is not ordered today.    Recent Labs: 10/03/2022: BUN 17; Creatinine, Ser 0.90; Hemoglobin 13.3; Potassium 3.8; Sodium 140 10/23/2022: ALT 22  Recent Lipid Panel    Component Value Date/Time   CHOL 124 10/23/2022 1002   TRIG 106 10/23/2022 1002   HDL 44 10/23/2022 1002   CHOLHDL 2.8 10/23/2022 1002   LDLCALC 60 10/23/2022 1002   Dated 04/02/23: A1c 5.7%. cholesterol 143, triglycerides 189, HDL 47, LDL 64. CMET normal.   Risk Assessment/Calculations:                Physical Exam:    VS:  BP 122/74   Pulse 74   Ht  5' 11 (1.803 m)   Wt 222 lb 6.4 oz (100.9 kg)   SpO2 100%   BMI 31.02 kg/m     Wt Readings from Last 3 Encounters:  04/10/23 222 lb 6.4 oz (100.9 kg)  10/03/22 207 lb 8 oz (94.1 kg)  09/06/22 206 lb 9.6 oz (93.7 kg)     GEN:  Well nourished, well developed in no acute distress HEENT: Normal NECK: No JVD; No carotid bruits LYMPHATICS: No lymphadenopathy CARDIAC: RRR, no murmurs, rubs, gallops RESPIRATORY:  Clear to auscultation without rales, wheezing or rhonchi  ABDOMEN: Soft, non-tender, non-distended MUSCULOSKELETAL:  no edema, varicose veins; No deformity  SKIN: Warm and dry NEUROLOGIC:  Alert and oriented x 3 PSYCHIATRIC:  Normal affect   ASSESSMENT:    1. Nonobstructive atherosclerosis of coronary artery   2. Primary hypertension   3. Hypercholesteremia   4. Coronary artery calcification     PLAN:    In order of problems listed above:  Patient has a very high calcium  score. No significant angina. Myoview  in May 2024 was normal. Recommend risk factor modification. Heart healthy diet and regular aerobic activity. Continue ASA 81 mg daily.  HTN controlled on lisinopril HCT HLD. LDL 64 on high dose Crestor  40 mg daily. Encourage weight loss and heart healthy diet. Limit sugar intake.  Prostate CA. S/p RT     Follow up in one year Meds ordered this encounter  Medications   rosuvastatin  (CRESTOR ) 40 MG tablet    Sig: Take 1 tablet (40 mg total) by mouth daily.    Dispense:  90 tablet    Refill:  3    There are no Patient Instructions on file for this visit.   Signed, Kamerin Grumbine, MD  04/10/2023 8:38 AM    [facility],

## 2023-04-10 ENCOUNTER — Encounter: Payer: Self-pay | Admitting: Cardiology

## 2023-04-10 ENCOUNTER — Ambulatory Visit: Payer: Medicare HMO | Attending: Cardiology | Admitting: Cardiology

## 2023-04-10 VITALS — BP 122/74 | HR 74 | Ht 71.0 in | Wt 222.4 lb

## 2023-04-10 DIAGNOSIS — I10 Essential (primary) hypertension: Secondary | ICD-10-CM

## 2023-04-10 DIAGNOSIS — I2510 Atherosclerotic heart disease of native coronary artery without angina pectoris: Secondary | ICD-10-CM

## 2023-04-10 DIAGNOSIS — E7800 Pure hypercholesterolemia, unspecified: Secondary | ICD-10-CM | POA: Diagnosis not present

## 2023-04-10 MED ORDER — ROSUVASTATIN CALCIUM 40 MG PO TABS: 40.0000 mg | ORAL_TABLET | Freq: Every day | ORAL | 3 refills | Status: DC

## 2023-04-10 NOTE — Patient Instructions (Signed)
 Medication Instructions:  NO changes *If you need a refill on your cardiac medications before your next appointment, please call your pharmacy*   Follow-Up: At [facility], you and your health needs are our priority.  As part of our continuing mission to provide you with exceptional heart care, we have created designated Provider Care Teams.  These Care Teams include your primary Cardiologist (physician) and Advanced Practice Providers (APPs -  Physician Assistants and Nurse Practitioners) who all work together to provide you with the care you need, when you need it.  We recommend signing up for the patient portal called MyChart.  Sign up information is provided on this After Visit Summary.  MyChart is used to connect with patients for Virtual Visits (Telemedicine).  Patients are able to view lab/test results, encounter notes, upcoming appointments, etc.  Non-urgent messages can be sent to your provider as well.   To learn more about what you can do with MyChart, go to [email or web address].    Your next appointment:   12 month(s)  Provider:   Peter Jordan, MD

## 2023-05-07 ENCOUNTER — Other Ambulatory Visit: Payer: Self-pay | Admitting: Radiation Oncology

## 2023-05-07 DIAGNOSIS — T07XXXA Unspecified multiple injuries, initial encounter: Secondary | ICD-10-CM | POA: Diagnosis not present

## 2023-05-25 DIAGNOSIS — R531 Weakness: Secondary | ICD-10-CM | POA: Diagnosis not present

## 2023-05-29 ENCOUNTER — Other Ambulatory Visit: Payer: Self-pay | Admitting: Family Medicine

## 2023-05-29 DIAGNOSIS — R531 Weakness: Secondary | ICD-10-CM

## 2023-06-07 ENCOUNTER — Ambulatory Visit
Admission: RE | Admit: 2023-06-07 | Discharge: 2023-06-07 | Disposition: A | Discharge status: Home / Self Care | Source: Ambulatory Visit | Attending: Family Medicine | Admitting: Family Medicine

## 2023-06-07 DIAGNOSIS — R42 Dizziness and giddiness: Secondary | ICD-10-CM | POA: Diagnosis not present

## 2023-06-07 DIAGNOSIS — G319 Degenerative disease of nervous system, unspecified: Secondary | ICD-10-CM | POA: Diagnosis not present

## 2023-06-07 DIAGNOSIS — I6782 Cerebral ischemia: Secondary | ICD-10-CM | POA: Diagnosis not present

## 2023-06-07 DIAGNOSIS — R531 Weakness: Secondary | ICD-10-CM | POA: Diagnosis not present

## 2023-10-01 DIAGNOSIS — Z0184 Encounter for antibody response examination: Secondary | ICD-10-CM | POA: Diagnosis not present

## 2023-10-30 DIAGNOSIS — L821 Other seborrheic keratosis: Secondary | ICD-10-CM | POA: Diagnosis not present

## 2023-10-30 DIAGNOSIS — Z6830 Body mass index (BMI) 30.0-30.9, adult: Secondary | ICD-10-CM | POA: Diagnosis not present

## 2024-02-28 DIAGNOSIS — C61 Malignant neoplasm of prostate: Secondary | ICD-10-CM | POA: Diagnosis not present

## 2024-03-06 DIAGNOSIS — N401 Enlarged prostate with lower urinary tract symptoms: Secondary | ICD-10-CM | POA: Diagnosis not present

## 2024-03-06 DIAGNOSIS — C61 Malignant neoplasm of prostate: Secondary | ICD-10-CM | POA: Diagnosis not present

## 2024-03-06 DIAGNOSIS — R351 Nocturia: Secondary | ICD-10-CM | POA: Diagnosis not present

## 2024-04-22 ENCOUNTER — Other Ambulatory Visit: Payer: Self-pay | Admitting: Cardiology

## 2024-04-24 NOTE — Telephone Encounter (Signed)
 Patient need make an overdue appointment for refills 1st attempt. Thank
